# Patient Record
Sex: Male | Born: 1935 | Race: White | Hispanic: No | Smoking: Never smoker
Health system: Southern US, Community
[De-identification: ages and names within clinical notes are randomized; demographics above are authoritative.]

## PROBLEM LIST (undated history)

## (undated) DIAGNOSIS — R011 Cardiac murmur, unspecified: Secondary | ICD-10-CM

## (undated) DIAGNOSIS — C801 Malignant (primary) neoplasm, unspecified: Secondary | ICD-10-CM

## (undated) DIAGNOSIS — M1990 Unspecified osteoarthritis, unspecified site: Secondary | ICD-10-CM

## (undated) DIAGNOSIS — Z8709 Personal history of other diseases of the respiratory system: Secondary | ICD-10-CM

## (undated) DIAGNOSIS — R32 Unspecified urinary incontinence: Secondary | ICD-10-CM

## (undated) DIAGNOSIS — F32A Depression, unspecified: Secondary | ICD-10-CM

## (undated) DIAGNOSIS — J302 Other seasonal allergic rhinitis: Secondary | ICD-10-CM

## (undated) DIAGNOSIS — Z8619 Personal history of other infectious and parasitic diseases: Secondary | ICD-10-CM

## (undated) DIAGNOSIS — I6529 Occlusion and stenosis of unspecified carotid artery: Secondary | ICD-10-CM

## (undated) DIAGNOSIS — G709 Myoneural disorder, unspecified: Secondary | ICD-10-CM

## (undated) DIAGNOSIS — F419 Anxiety disorder, unspecified: Secondary | ICD-10-CM

## (undated) DIAGNOSIS — H269 Unspecified cataract: Secondary | ICD-10-CM

## (undated) DIAGNOSIS — N059 Unspecified nephritic syndrome with unspecified morphologic changes: Secondary | ICD-10-CM

## (undated) DIAGNOSIS — J45909 Unspecified asthma, uncomplicated: Secondary | ICD-10-CM

## (undated) HISTORY — DX: Occlusion and stenosis of unspecified carotid artery: I65.29

## (undated) HISTORY — DX: Personal history of other infectious and parasitic diseases: Z86.19

## (undated) HISTORY — DX: Unspecified osteoarthritis, unspecified site: M19.90

## (undated) HISTORY — DX: Unspecified urinary incontinence: R32

## (undated) HISTORY — DX: Other seasonal allergic rhinitis: J30.2

## (undated) HISTORY — DX: Cardiac murmur, unspecified: R01.1

## (undated) HISTORY — DX: Unspecified nephritic syndrome with unspecified morphologic changes: N05.9

## (undated) HISTORY — DX: Malignant (primary) neoplasm, unspecified: C80.1

## (undated) HISTORY — DX: Personal history of other diseases of the respiratory system: Z87.09

## (undated) HISTORY — DX: Unspecified cataract: H26.9

## (undated) HISTORY — DX: Myoneural disorder, unspecified: G70.9

## (undated) HISTORY — DX: Depression, unspecified: F32.A

## (undated) HISTORY — DX: Anxiety disorder, unspecified: F41.9

## (undated) HISTORY — PX: EYE SURGERY: SHX253

## (undated) HISTORY — DX: Unspecified asthma, uncomplicated: J45.909

## (undated) HISTORY — PX: SCROTAL SURGERY: SHX2387

---

## 1981-06-08 DIAGNOSIS — Z8709 Personal history of other diseases of the respiratory system: Secondary | ICD-10-CM

## 1981-06-08 HISTORY — DX: Personal history of other diseases of the respiratory system: Z87.09

## 2005-08-07 HISTORY — PX: COLONOSCOPY: SHX174

## 2014-06-12 DIAGNOSIS — Z7982 Long term (current) use of aspirin: Secondary | ICD-10-CM | POA: Diagnosis not present

## 2014-06-12 DIAGNOSIS — B349 Viral infection, unspecified: Secondary | ICD-10-CM | POA: Diagnosis not present

## 2014-08-06 DIAGNOSIS — Z125 Encounter for screening for malignant neoplasm of prostate: Secondary | ICD-10-CM | POA: Diagnosis not present

## 2014-08-06 DIAGNOSIS — E785 Hyperlipidemia, unspecified: Secondary | ICD-10-CM | POA: Diagnosis not present

## 2014-08-06 DIAGNOSIS — Z23 Encounter for immunization: Secondary | ICD-10-CM | POA: Diagnosis not present

## 2014-08-06 DIAGNOSIS — R590 Localized enlarged lymph nodes: Secondary | ICD-10-CM | POA: Diagnosis not present

## 2014-08-06 DIAGNOSIS — R0989 Other specified symptoms and signs involving the circulatory and respiratory systems: Secondary | ICD-10-CM | POA: Diagnosis not present

## 2014-08-06 DIAGNOSIS — Z0001 Encounter for general adult medical examination with abnormal findings: Secondary | ICD-10-CM | POA: Diagnosis not present

## 2014-08-06 DIAGNOSIS — R350 Frequency of micturition: Secondary | ICD-10-CM | POA: Diagnosis not present

## 2014-08-06 DIAGNOSIS — R011 Cardiac murmur, unspecified: Secondary | ICD-10-CM | POA: Diagnosis not present

## 2014-08-06 LAB — COMPREHENSIVE METABOLIC PANEL
ALT: 19
AST: 24 U/L
Alkaline Phosphatase: 74 U/L
BILIRUBIN, TOTAL: 0.5
BUN: 23 mg/dL — AB (ref 4–21)
CREATININE: 0.8
GLUCOSE: 100
POTASSIUM: 4.2 mmol/L
Sodium: 141

## 2014-08-06 LAB — LIPID PANEL
Cholesterol: 174 mg/dL (ref 0–200)
HDL: 47 mg/dL (ref 35–70)
LDL Cholesterol: 111 mg/dL
Triglycerides: 82 mg/dL (ref 40–160)

## 2014-08-06 LAB — PSA: PSA: 2.45

## 2014-08-10 DIAGNOSIS — R0989 Other specified symptoms and signs involving the circulatory and respiratory systems: Secondary | ICD-10-CM | POA: Diagnosis not present

## 2014-08-22 DIAGNOSIS — R011 Cardiac murmur, unspecified: Secondary | ICD-10-CM | POA: Diagnosis not present

## 2014-08-27 DIAGNOSIS — R011 Cardiac murmur, unspecified: Secondary | ICD-10-CM | POA: Diagnosis not present

## 2014-08-27 DIAGNOSIS — R590 Localized enlarged lymph nodes: Secondary | ICD-10-CM | POA: Diagnosis not present

## 2014-08-27 DIAGNOSIS — R0989 Other specified symptoms and signs involving the circulatory and respiratory systems: Secondary | ICD-10-CM | POA: Diagnosis not present

## 2014-09-12 DIAGNOSIS — R590 Localized enlarged lymph nodes: Secondary | ICD-10-CM | POA: Diagnosis not present

## 2014-11-03 DIAGNOSIS — Z7982 Long term (current) use of aspirin: Secondary | ICD-10-CM | POA: Diagnosis not present

## 2014-11-03 DIAGNOSIS — Z79899 Other long term (current) drug therapy: Secondary | ICD-10-CM | POA: Diagnosis not present

## 2014-11-03 DIAGNOSIS — L02212 Cutaneous abscess of back [any part, except buttock]: Secondary | ICD-10-CM | POA: Diagnosis not present

## 2014-11-07 DIAGNOSIS — L720 Epidermal cyst: Secondary | ICD-10-CM | POA: Diagnosis not present

## 2014-11-07 DIAGNOSIS — J45909 Unspecified asthma, uncomplicated: Secondary | ICD-10-CM | POA: Diagnosis not present

## 2014-11-07 DIAGNOSIS — N503 Cyst of epididymis: Secondary | ICD-10-CM | POA: Diagnosis not present

## 2014-11-14 DIAGNOSIS — L720 Epidermal cyst: Secondary | ICD-10-CM | POA: Diagnosis not present

## 2015-03-12 DIAGNOSIS — Z85828 Personal history of other malignant neoplasm of skin: Secondary | ICD-10-CM | POA: Diagnosis not present

## 2015-03-12 DIAGNOSIS — L570 Actinic keratosis: Secondary | ICD-10-CM | POA: Diagnosis not present

## 2015-03-12 DIAGNOSIS — D2262 Melanocytic nevi of left upper limb, including shoulder: Secondary | ICD-10-CM | POA: Diagnosis not present

## 2015-03-12 DIAGNOSIS — L821 Other seborrheic keratosis: Secondary | ICD-10-CM | POA: Diagnosis not present

## 2015-03-12 DIAGNOSIS — Z08 Encounter for follow-up examination after completed treatment for malignant neoplasm: Secondary | ICD-10-CM | POA: Diagnosis not present

## 2015-03-12 DIAGNOSIS — D2261 Melanocytic nevi of right upper limb, including shoulder: Secondary | ICD-10-CM | POA: Diagnosis not present

## 2015-03-12 DIAGNOSIS — L814 Other melanin hyperpigmentation: Secondary | ICD-10-CM | POA: Diagnosis not present

## 2015-03-12 DIAGNOSIS — L820 Inflamed seborrheic keratosis: Secondary | ICD-10-CM | POA: Diagnosis not present

## 2015-04-29 DIAGNOSIS — Z23 Encounter for immunization: Secondary | ICD-10-CM | POA: Diagnosis not present

## 2015-06-17 DIAGNOSIS — L570 Actinic keratosis: Secondary | ICD-10-CM | POA: Diagnosis not present

## 2015-07-01 DIAGNOSIS — R350 Frequency of micturition: Secondary | ICD-10-CM | POA: Diagnosis not present

## 2015-08-22 DIAGNOSIS — J019 Acute sinusitis, unspecified: Secondary | ICD-10-CM | POA: Diagnosis not present

## 2015-11-11 ENCOUNTER — Encounter: Payer: Self-pay | Admitting: Primary Care

## 2015-11-11 ENCOUNTER — Ambulatory Visit (INDEPENDENT_AMBULATORY_CARE_PROVIDER_SITE_OTHER): Payer: Medicare Other | Admitting: Primary Care

## 2015-11-11 VITALS — BP 124/64 | HR 96 | Temp 98.3°F | Ht 70.0 in | Wt 171.1 lb

## 2015-11-11 DIAGNOSIS — J209 Acute bronchitis, unspecified: Secondary | ICD-10-CM

## 2015-11-11 MED ORDER — AMOXICILLIN-POT CLAVULANATE 875-125 MG PO TABS: 1.0000 | ORAL_TABLET | Freq: Two times a day (BID) | ORAL | Status: DC

## 2015-11-11 NOTE — Progress Notes (Signed)
Pre visit review using our clinic review tool, if applicable. No additional management support is needed unless otherwise documented below in the visit note. 

## 2015-11-11 NOTE — Patient Instructions (Signed)
Start Augmentin antibiotics. Take 1 tablet by mouth twice daily for 10 days.  Continue Robitussin as needed for cough.   You may also try taking Mucinex DM for chest congestion. Ensure you take this with a full glass of water.  Ensure you are staying hydrated with water.  Please call me if no improvement in 3-4 days.  It was a pleasure meeting you!  Acute Bronchitis Bronchitis is inflammation of the airways that extend from the windpipe into the lungs (bronchi). The inflammation often causes mucus to develop. This leads to a cough, which is the most common symptom of bronchitis.  In acute bronchitis, the condition usually develops suddenly and goes away over time, usually in a couple weeks. Smoking, allergies, and asthma can make bronchitis worse. Repeated episodes of bronchitis may cause further lung problems.  CAUSES Acute bronchitis is most often caused by the same virus that causes a cold. The virus can spread from person to person (contagious) through coughing, sneezing, and touching contaminated objects. SIGNS AND SYMPTOMS   Cough.   Fever.   Coughing up mucus.   Body aches.   Chest congestion.   Chills.   Shortness of breath.   Sore throat.  DIAGNOSIS  Acute bronchitis is usually diagnosed through a physical exam. Your health care provider will also ask you questions about your medical history. Tests, such as chest X-rays, are sometimes done to rule out other conditions.  TREATMENT  Acute bronchitis usually goes away in a couple weeks. Oftentimes, no medical treatment is necessary. Medicines are sometimes given for relief of fever or cough. Antibiotic medicines are usually not needed but may be prescribed in certain situations. In some cases, an inhaler may be recommended to help reduce shortness of breath and control the cough. A cool mist vaporizer may also be used to help thin bronchial secretions and make it easier to clear the chest.  HOME CARE  INSTRUCTIONS  Get plenty of rest.   Drink enough fluids to keep your urine clear or pale yellow (unless you have a medical condition that requires fluid restriction). Increasing fluids may help thin your respiratory secretions (sputum) and reduce chest congestion, and it will prevent dehydration.   Take medicines only as directed by your health care provider.  If you were prescribed an antibiotic medicine, finish it all even if you start to feel better.  Avoid smoking and secondhand smoke. Exposure to cigarette smoke or irritating chemicals will make bronchitis worse. If you are a smoker, consider using nicotine gum or skin patches to help control withdrawal symptoms. Quitting smoking will help your lungs heal faster.   Reduce the chances of another bout of acute bronchitis by washing your hands frequently, avoiding people with cold symptoms, and trying not to touch your hands to your mouth, nose, or eyes.   Keep all follow-up visits as directed by your health care provider.  SEEK MEDICAL CARE IF: Your symptoms do not improve after 1 week of treatment.  SEEK IMMEDIATE MEDICAL CARE IF:  You develop an increased fever or chills.   You have chest pain.   You have severe shortness of breath.  You have bloody sputum.   You develop dehydration.  You faint or repeatedly feel like you are going to pass out.  You develop repeated vomiting.  You develop a severe headache. MAKE SURE YOU:   Understand these instructions.  Will watch your condition.  Will get help right away if you are not doing well or get worse.  This information is not intended to replace advice given to you by your health care provider. Make sure you discuss any questions you have with your health care provider.   Document Released: 07/02/2004 Document Revised: 06/15/2014 Document Reviewed: 11/15/2012 Elsevier Interactive Patient Education [employer].

## 2015-11-11 NOTE — Progress Notes (Signed)
   Subjective:    Patient ID: Harry Ray, male    DOB: 19-Jan-1932, 82 y.o.   MRN: [id number]  HPI  Harry Ray is an 82 year old male, new to our practice to establish with Dr. Danise Mina in September 2017, who presents today with a chief complaint of nasal congestion. He also reports cough, chest congestion, fatigue. His symptoms have been present for the past 2 weeks. Denies fevers, shortness of breath, sore throat, ear pain.   His cough is productive with yellow sputum. He's also blowing yellow mucous from his nasal cavity. He's been taking Robitussin and aspirin without much improvement. Overall his symptoms are about the same. Denies sick contacts.   Review of Systems  Constitutional: Positive for fatigue. Negative for fever.  HENT: Positive for sinus pressure. Negative for congestion, ear pain and sore throat.   Respiratory: Positive for cough. Negative for shortness of breath.   Cardiovascular: Negative for chest pain.       No past medical history on file.   Social History   Social History  . Marital Status: Married    Spouse Name: N/A  . Number of Children: N/A  . Years of Education: N/A   Occupational History  . Not on file.   Social History Main Topics  . Smoking status: Never Smoker   . Smokeless tobacco: Not on file  . Alcohol Use: No  . Drug Use: No  . Sexual Activity: Not on file   Other Topics Concern  . Not on file   Social History Narrative  . No narrative on file    No past surgical history on file.  No family history on file.  No Known Allergies  No current outpatient prescriptions on file prior to visit.   No current facility-administered medications on file prior to visit.    BP 124/64 mmHg  Pulse 96  Temp(Src) 98.3 F (36.8 C) (Oral)  Ht 5\' 10"  (1.778 m)  Wt 171 lb 1.9 oz (77.62 kg)  BMI 24.55 kg/m2  SpO2 96%    Objective:   Physical Exam  Constitutional: He appears well-nourished. He appears ill.  HENT:  Right Ear:  Tympanic membrane and ear canal normal.  Left Ear: Tympanic membrane and ear canal normal.  Nose: No mucosal edema. Right sinus exhibits no maxillary sinus tenderness and no frontal sinus tenderness. Left sinus exhibits no maxillary sinus tenderness and no frontal sinus tenderness.  Mouth/Throat: Oropharynx is clear and moist.  Eyes: Conjunctivae are normal.  Neck: Neck supple.  Cardiovascular: Normal rate and regular rhythm.   Pulmonary/Chest: Effort normal. He has no wheezes. He has rhonchi in the right upper field, the right lower field, the left upper field and the left lower field.  Skin: Skin is warm and dry.          Assessment & Plan:  Acute Bronchitis:  Cough, chest congestion, fatigue x 2 weeks. Temporary improvement with Robitussin. Exam today with moderate rhonchi throughout all lung fields. No diminished sounds, vitals stable. Does appear ill, but not toxic. Given duration of symptoms, presentation, examination, will treat for likely bacterial involvement. Rx for Augmentin course sent to pharmacy. Continue Robitussin PRN. Start Mucinex DM for chest congestion. Fluids, rest, return precautions provided.

## 2016-02-14 ENCOUNTER — Telehealth: Payer: Self-pay | Admitting: Family Medicine

## 2016-02-14 NOTE — Telephone Encounter (Signed)
Rec'd from [place] forward 40 pages to Dr.Gutlerrez

## 2016-02-18 ENCOUNTER — Ambulatory Visit (INDEPENDENT_AMBULATORY_CARE_PROVIDER_SITE_OTHER): Payer: Medicare Other | Admitting: Family Medicine

## 2016-02-18 ENCOUNTER — Encounter: Payer: Self-pay | Admitting: Family Medicine

## 2016-02-18 VITALS — BP 136/82 | HR 80 | Temp 98.1°F | Ht 69.5 in | Wt 174.8 lb

## 2016-02-18 DIAGNOSIS — R399 Unspecified symptoms and signs involving the genitourinary system: Secondary | ICD-10-CM | POA: Diagnosis not present

## 2016-02-18 DIAGNOSIS — R011 Cardiac murmur, unspecified: Secondary | ICD-10-CM | POA: Insufficient documentation

## 2016-02-18 DIAGNOSIS — Z23 Encounter for immunization: Secondary | ICD-10-CM | POA: Diagnosis not present

## 2016-02-18 DIAGNOSIS — N400 Enlarged prostate without lower urinary tract symptoms: Secondary | ICD-10-CM | POA: Insufficient documentation

## 2016-02-18 LAB — POC URINALSYSI DIPSTICK (AUTOMATED)
Bilirubin, UA: NEGATIVE
Glucose, UA: NEGATIVE
KETONES UA: NEGATIVE
Leukocytes, UA: NEGATIVE
Nitrite, UA: NEGATIVE
PH UA: 6
PROTEIN UA: NEGATIVE
RBC UA: NEGATIVE
SPEC GRAV UA: 1.01
Urobilinogen, UA: 0.2

## 2016-02-18 MED ORDER — TAMSULOSIN HCL 0.4 MG PO CAPS: 0.4000 mg | ORAL_CAPSULE | Freq: Every day | ORAL | 3 refills | Status: DC

## 2016-02-18 NOTE — Assessment & Plan Note (Signed)
Heard today. Discussed red flags to monitor for.

## 2016-02-18 NOTE — Assessment & Plan Note (Addendum)
Ongoing for 2 yrs. Check UA today.  Describes urge incontinence.  ?BPH related - will trial flomax 0.4mg  nightly.  Will review prior PCP records when they arrive. Pt will update me with effect in 2-4 wks.  Pt agrees with plan.

## 2016-02-18 NOTE — Progress Notes (Signed)
Pre visit review using our clinic review tool, if applicable. No additional management support is needed unless otherwise documented below in the visit note. 

## 2016-02-18 NOTE — Patient Instructions (Addendum)
Flu shot today Urinalysis today - normal.  Urine symptoms likely from prostate. Trial flomax for flow.  We will await records from prior PCP and review when they arrive. Return as needed or in Spring 2018 for your next medicare wellness visit Good to meet you today, call us with questions.

## 2016-02-18 NOTE — Progress Notes (Signed)
BP 136/82   Pulse 80   Temp 98.1 F (36.7 C) (Oral)   Ht 5' 9.5" (1.765 m)   Wt 174 lb 12 oz (79.3 kg)   BMI 25.44 kg/m    CC: new pt to establish care Subjective:    Patient ID: Harry Ray, male    DOB: 05-18-1932, 82 y.o.   MRN: [id number]  HPI: Harry Ray is a 82 y.o. male presenting on 02/18/2016 for Establish Care   Prior saw Dr Katheran Awe, records have been requested. Recently moved to [facility] in May 2017 from [place]. Saw Anda Kraft 3 mo ago for acute bronchitis.   Ongoing urinary symptoms over last 2-3 yrs - nocturia 1-3x, daytime urgency, slight leakage. Incomplete emptying. Has never been told abnormal prostate enlargement. Denies fevers/chills, nausea/vomiting, dysuria, hematuria. No flank pain, abd pain, back pain. No foot on floor or key in door phenomenon. + urinary urge when turning on water. No stress incontinence symptoms. No hesitancy of stream or dribbling, some weakening of stream.   Preventative: Last CPE spring 2017  Lives with wife at [facility], no pets Son and family live in [place]: retired, was [employer] Activity: starting [employer] exercise program 2x/wk, enjoys walking  Relevant past medical, surgical, family and social history reviewed and updated as indicated. Interim medical history since our last visit reviewed. Allergies and medications reviewed and updated. Current Outpatient Prescriptions on File Prior to Visit  Medication Sig  . aspirin 81 MG tablet Take 81 mg by mouth daily.   No current facility-administered medications on file prior to visit.    Past Medical History:  Diagnosis Date  . Arthritis    pinkys bilaterally and left thumb  . Heart murmur    minor  . History of asthma 1983   asthma attack as reaction to allergy medication (2d ICU stay)  . History of chicken pox   . Nephritis childhood  . Seasonal allergies    spring  . Urine incontinence     Past Surgical History:    Procedure Laterality Date  . SCROTAL SURGERY     ? benign cyst removal    Family History  Problem Relation Age of Onset  . Dementia Mother   . Cancer Maternal Grandfather     stomach  . Cancer Paternal Uncle     throat - smoker  . Diabetes Neg Hx   . CAD Neg Hx     Social History  Substance Use Topics  . Smoking status: Never Smoker  . Smokeless tobacco: Never Used  . Alcohol use No    Review of Systems Per HPI unless specifically indicated in ROS section     Objective:    BP 136/82   Pulse 80   Temp 98.1 F (36.7 C) (Oral)   Ht 5' 9.5" (1.765 m)   Wt 174 lb 12 oz (79.3 kg)   BMI 25.44 kg/m   Wt Readings from Last 3 Encounters:  02/18/16 174 lb 12 oz (79.3 kg)  11/11/15 171 lb 1.9 oz (77.6 kg)    Physical Exam  Constitutional: He appears well-developed and well-nourished. No distress.  HENT:  Head: Normocephalic and atraumatic.  Mouth/Throat: Oropharynx is clear and moist. No oropharyngeal exudate.  Eyes: Conjunctivae are normal. Pupils are equal, round, and reactive to light. No scleral icterus.  Neck: Normal range of motion. Neck supple. No thyromegaly present.  Cardiovascular: Normal rate, regular rhythm and intact distal pulses.   Murmur (  2/6 SEM) heard. Pulmonary/Chest: Effort normal and breath sounds normal. No respiratory distress. He has no wheezes. He has no rales.  Abdominal: Soft. Normal appearance and bowel sounds are normal. He exhibits no distension and no mass. There is no hepatosplenomegaly. There is no tenderness. There is no rebound, no guarding and no CVA tenderness.  Musculoskeletal: He exhibits no edema.  Lymphadenopathy:    He has no cervical adenopathy.  Skin: Skin is warm and dry. No rash noted.  Psychiatric: He has a normal mood and affect.  Nursing note and vitals reviewed.  Results for orders placed or performed in visit on 02/18/16  POCT Urinalysis Dipstick (Automated)  Result Value Ref Range   Color, UA Straw    Clarity, UA  Clear    Glucose, UA Negative    Bilirubin, UA Negative    Ketones, UA Negative    Spec Grav, UA 1.010    Blood, UA Negative    pH, UA 6.0    Protein, UA Negative    Urobilinogen, UA 0.2    Nitrite, UA Negative    Leukocytes, UA Negative Negative      Assessment & Plan:  Over 25 minutes were spent face-to-face with the patient during this encounter and >50% of that time was spent on counseling and coordination of care  Problem List Items Addressed This Visit    Heart murmur    Heard today. Discussed red flags to monitor for.      Lower urinary tract symptoms (LUTS) - Primary    Ongoing for 2 yrs. Check UA today.  Describes urge incontinence.  ?BPH related - will trial flomax 0.4mg  nightly.  Will review prior PCP records when they arrive. Pt will update me with effect in 2-4 wks.  Pt agrees with plan.      Relevant Orders   POCT Urinalysis Dipstick (Automated) (Completed)    Other Visit Diagnoses    Need for influenza vaccination       Relevant Orders   Flu Vaccine QUAD 36+ mos PF IM (Fluarix & Fluzone Quad PF) (Completed)       Follow up plan: Return for medicare wellness visit.  Ria Bush, MD

## 2016-03-07 ENCOUNTER — Encounter: Payer: Self-pay | Admitting: Family Medicine

## 2016-03-11 ENCOUNTER — Encounter: Payer: Self-pay | Admitting: *Deleted

## 2016-03-17 ENCOUNTER — Encounter: Payer: Self-pay | Admitting: Family Medicine

## 2016-03-18 ENCOUNTER — Encounter: Payer: Self-pay | Admitting: Primary Care

## 2016-04-01 ENCOUNTER — Encounter: Payer: Self-pay | Admitting: Family Medicine

## 2016-04-02 MED ORDER — TAMSULOSIN HCL 0.4 MG PO CAPS: 0.8000 mg | ORAL_CAPSULE | Freq: Every day | ORAL | 6 refills | Status: DC

## 2016-04-27 DIAGNOSIS — L821 Other seborrheic keratosis: Secondary | ICD-10-CM | POA: Diagnosis not present

## 2016-04-27 DIAGNOSIS — X32XXXA Exposure to sunlight, initial encounter: Secondary | ICD-10-CM | POA: Diagnosis not present

## 2016-04-27 DIAGNOSIS — L570 Actinic keratosis: Secondary | ICD-10-CM | POA: Diagnosis not present

## 2016-04-27 DIAGNOSIS — D225 Melanocytic nevi of trunk: Secondary | ICD-10-CM | POA: Diagnosis not present

## 2016-06-11 DIAGNOSIS — H353131 Nonexudative age-related macular degeneration, bilateral, early dry stage: Secondary | ICD-10-CM | POA: Diagnosis not present

## 2016-08-16 ENCOUNTER — Other Ambulatory Visit: Payer: Self-pay | Admitting: Family Medicine

## 2016-08-16 DIAGNOSIS — R399 Unspecified symptoms and signs involving the genitourinary system: Secondary | ICD-10-CM

## 2016-08-17 ENCOUNTER — Other Ambulatory Visit: Payer: Medicare Other

## 2016-08-17 ENCOUNTER — Ambulatory Visit: Payer: Medicare Other

## 2016-08-18 ENCOUNTER — Encounter: Payer: Self-pay | Admitting: Primary Care

## 2016-08-18 NOTE — Telephone Encounter (Signed)
Hi [name],  See the My Chart message below regarding rescheduling the [place]. Looks like he was scheduled to see you yesterday which was cancelled due to the weather. Please respond to him, thanks! Allie Bossier, NP-C

## 2016-08-19 ENCOUNTER — Other Ambulatory Visit (INDEPENDENT_AMBULATORY_CARE_PROVIDER_SITE_OTHER): Payer: Medicare Other

## 2016-08-19 ENCOUNTER — Other Ambulatory Visit: Payer: Self-pay | Admitting: Family Medicine

## 2016-08-19 DIAGNOSIS — R399 Unspecified symptoms and signs involving the genitourinary system: Secondary | ICD-10-CM | POA: Diagnosis not present

## 2016-08-19 DIAGNOSIS — R3914 Feeling of incomplete bladder emptying: Secondary | ICD-10-CM

## 2016-08-19 DIAGNOSIS — N401 Enlarged prostate with lower urinary tract symptoms: Secondary | ICD-10-CM

## 2016-08-19 LAB — CBC WITH DIFFERENTIAL/PLATELET
BASOS PCT: 0.3 % (ref 0.0–3.0)
Basophils Absolute: 0 10*3/uL (ref 0.0–0.1)
Eosinophils Absolute: 0.4 10*3/uL (ref 0.0–0.7)
Eosinophils Relative: 5.3 % — ABNORMAL HIGH (ref 0.0–5.0)
HEMATOCRIT: 41.1 % (ref 39.0–52.0)
HEMOGLOBIN: 13.9 g/dL (ref 13.0–17.0)
Lymphocytes Relative: 21.1 % (ref 12.0–46.0)
Lymphs Abs: 1.5 10*3/uL (ref 0.7–4.0)
MCHC: 33.8 g/dL (ref 30.0–36.0)
MCV: 93.2 fl (ref 78.0–100.0)
MONOS PCT: 9.3 % (ref 3.0–12.0)
Monocytes Absolute: 0.6 10*3/uL (ref 0.1–1.0)
NEUTROS ABS: 4.4 10*3/uL (ref 1.4–7.7)
Neutrophils Relative %: 64 % (ref 43.0–77.0)
PLATELETS: 256 10*3/uL (ref 150.0–400.0)
RBC: 4.41 Mil/uL (ref 4.22–5.81)
RDW: 13.2 % (ref 11.5–15.5)
WBC: 6.9 10*3/uL (ref 4.0–10.5)

## 2016-08-19 LAB — COMPREHENSIVE METABOLIC PANEL
ALBUMIN: 4.2 g/dL (ref 3.5–5.2)
ALT: 13 U/L (ref 0–53)
AST: 17 U/L (ref 0–37)
Alkaline Phosphatase: 53 U/L (ref 39–117)
BUN: 20 mg/dL (ref 6–23)
CALCIUM: 9.4 mg/dL (ref 8.4–10.5)
CHLORIDE: 105 meq/L (ref 96–112)
CO2: 28 meq/L (ref 19–32)
Creatinine, Ser: 0.87 mg/dL (ref 0.40–1.50)
GFR: 89.08 mL/min (ref >60.00)
Glucose, Bld: 92 mg/dL (ref 70–99)
POTASSIUM: 4.1 meq/L (ref 3.5–5.1)
Sodium: 140 mEq/L (ref 135–145)
Total Bilirubin: 0.6 mg/dL (ref 0.2–1.2)
Total Protein: 7.1 g/dL (ref 6.0–8.3)

## 2016-08-19 NOTE — Addendum Note (Signed)
Addended by: Royann Shivers A on: 08/19/2016 01:48 PM   Modules accepted: Orders

## 2016-08-19 NOTE — Addendum Note (Signed)
Addended by: Ellamae Sia on: 08/19/2016 03:50 PM   Modules accepted: Orders

## 2016-08-21 ENCOUNTER — Ambulatory Visit (INDEPENDENT_AMBULATORY_CARE_PROVIDER_SITE_OTHER): Payer: Medicare Other | Admitting: Primary Care

## 2016-08-21 ENCOUNTER — Encounter: Payer: Self-pay | Admitting: Primary Care

## 2016-08-21 VITALS — BP 124/80 | HR 52 | Temp 97.9°F | Ht 69.5 in | Wt 176.1 lb

## 2016-08-21 DIAGNOSIS — L03312 Cellulitis of back [any part except buttock]: Secondary | ICD-10-CM | POA: Diagnosis not present

## 2016-08-21 DIAGNOSIS — L0292 Furuncle, unspecified: Secondary | ICD-10-CM

## 2016-08-21 MED ORDER — SULFAMETHOXAZOLE-TRIMETHOPRIM 800-160 MG PO TABS: 1.0000 | ORAL_TABLET | Freq: Two times a day (BID) | ORAL | 0 refills | Status: DC

## 2016-08-21 NOTE — Progress Notes (Signed)
Pre visit review using our clinic review tool, if applicable. No additional management support is needed unless otherwise documented below in the visit note. 

## 2016-08-21 NOTE — Patient Instructions (Signed)
Start Bactrim DS (sulfamethoxazole/trimethoprim) tablets for skin infection Take 1 tablet by mouth twice daily for 7 days.  Keep the area covered and dry. Allow the wound to drain as discussed.  Follow up with Dr. Danise Mina as scheduled.  It was a pleasure to see you today!

## 2016-08-21 NOTE — Progress Notes (Signed)
Subjective:    Patient ID: Harry Ray, male    DOB: Jul 22, 1931, 83 y.o.   MRN: [id number]  HPI  Mr. Hoecker is a 83 year old male who presents today with a chief complaint of skin wound. His wound is located to the mid lower thoracic back that he first noticed 3-4 days ago. He thinks he has a boil as it busted open. The drainage was mostly blood, but did see a white/yellow puss. His wife was able to expel some puss last night. He does have a history of boils in the past. He denies fevers, weakness, fatigue.   Review of Systems  Constitutional: Negative for fatigue and fever.  Skin: Positive for color change and wound.       Past Medical History:  Diagnosis Date  . Arthritis    pinkys bilaterally and left thumb  . Heart murmur    minor  . History of asthma 1983   asthma attack as reaction to allergy medication (2d ICU stay)  . History of chicken pox   . Nephritis childhood  . Seasonal allergies    spring  . Urine incontinence      Social History   Social History  . Marital status: Married    Spouse name: N/A  . Number of children: N/A  . Years of education: N/A   Occupational History  . Not on file.   Social History Main Topics  . Smoking status: Never Smoker  . Smokeless tobacco: Never Used  . Alcohol use No  . Drug use: No  . Sexual activity: Not on file   Other Topics Concern  . Not on file   Social History Narrative   Lives with wife at [facility], no pets   Son and family live in [place]: retired, was [employer]   Activity: starting [employer] exercise program 2x/wk, enjoys walking    Past Surgical History:  Procedure Laterality Date  . COLONOSCOPY  08/07/2005   diverticulosis  . SCROTAL SURGERY     ? benign cyst removal    Family History  Problem Relation Age of Onset  . Dementia Mother     Pick's disease  . Cancer Maternal Grandfather     stomach  . Cancer Paternal Uncle     throat - smoker  . Diabetes Neg Hx    . CAD Neg Hx     No Known Allergies  Current Outpatient Prescriptions on File Prior to Visit  Medication Sig Dispense Refill  . aspirin 81 MG tablet Take 81 mg by mouth daily.    . tamsulosin (FLOMAX) 0.4 MG CAPS capsule Take 2 capsules (0.8 mg total) by mouth daily. 60 capsule 6   No current facility-administered medications on file prior to visit.     BP 124/80   Pulse (!) 52   Temp 97.9 F (36.6 C) (Oral)   Ht 5' 9.5" (1.765 m)   Wt 176 lb 1.9 oz (79.9 kg)   SpO2 99%   BMI 25.64 kg/m    Objective:   Physical Exam  Constitutional: He appears well-nourished.  Neck: Neck supple.  Cardiovascular: Normal rate.   Pulmonary/Chest: Effort normal and breath sounds normal.  Skin: Skin is warm.  1 cm circular open wound with presence of light green puss. Surround erythema suggestive of cellulitis. Tender.          Assessment & Plan:  Cellulitis secondary to Abscess:  Skin wound x 3-4 days.  Opened with drainage last night. Exam today with opened abscess with mild surrounding cellulitis. Presence of light green puss today.  Rx for Bactrim DS tablets sent to pharmacy. Dressing applied. Discussed home care instructions.  Sheral Flow, NP

## 2016-08-25 ENCOUNTER — Encounter: Payer: Self-pay | Admitting: Family Medicine

## 2016-08-25 ENCOUNTER — Ambulatory Visit (INDEPENDENT_AMBULATORY_CARE_PROVIDER_SITE_OTHER): Payer: Medicare Other | Admitting: Family Medicine

## 2016-08-25 VITALS — BP 114/70 | HR 84 | Temp 97.6°F | Ht 70.5 in | Wt 172.0 lb

## 2016-08-25 DIAGNOSIS — R011 Cardiac murmur, unspecified: Secondary | ICD-10-CM | POA: Diagnosis not present

## 2016-08-25 DIAGNOSIS — R350 Frequency of micturition: Secondary | ICD-10-CM | POA: Diagnosis not present

## 2016-08-25 DIAGNOSIS — Z7189 Other specified counseling: Secondary | ICD-10-CM | POA: Insufficient documentation

## 2016-08-25 DIAGNOSIS — N401 Enlarged prostate with lower urinary tract symptoms: Secondary | ICD-10-CM | POA: Diagnosis not present

## 2016-08-25 DIAGNOSIS — Z0000 Encounter for general adult medical examination without abnormal findings: Secondary | ICD-10-CM | POA: Diagnosis not present

## 2016-08-25 DIAGNOSIS — R0989 Other specified symptoms and signs involving the circulatory and respiratory systems: Secondary | ICD-10-CM | POA: Diagnosis not present

## 2016-08-25 MED ORDER — FINASTERIDE 5 MG PO TABS: 5.0000 mg | ORAL_TABLET | Freq: Every day | ORAL | 11 refills | Status: DC

## 2016-08-25 NOTE — Patient Instructions (Addendum)
You are doing well today.  Bring me copy of your living will to update your chart.  Prostate blood test today. Let's start finasteride once daily.  We will check carotid ultrasound.  Return in 6 months for follow up prostate.   Health Maintenance, Male A healthy lifestyle and preventive care is important for your health and wellness. Ask your health care provider about what schedule of regular examinations is right for you. What should I know about weight and diet?  Eat a Healthy Diet  Eat plenty of vegetables, fruits, whole grains, low-fat dairy products, and lean protein.  Do not eat a lot of foods high in solid fats, added sugars, or salt. Maintain a Healthy Weight  Regular exercise can help you achieve or maintain a healthy weight. You should:  Do at least 150 minutes of exercise each week. The exercise should increase your heart rate and make you sweat (moderate-intensity exercise).  Do strength-training exercises at least twice a week. Watch Your Levels of Cholesterol and Blood Lipids  Have your blood tested for lipids and cholesterol every 5 years starting at 81 years of age. If you are at high risk for heart disease, you should start having your blood tested when you are 81 years old. You may need to have your cholesterol levels checked more often if:  Your lipid or cholesterol levels are high.  You are older than 81 years of age.  You are at high risk for heart disease. What should I know about cancer screening? Many types of cancers can be detected early and may often be prevented. Lung Cancer  You should be screened every year for lung cancer if:  You are a current smoker who has smoked for at least 30 years.  You are a former smoker who has quit within the past 15 years.  Talk to your health care provider about your screening options, when you should start screening, and how often you should be screened. Colorectal Cancer  Routine colorectal cancer screening  usually begins at 81 years of age and should be repeated every 5-10 years until you are 81 years old. You may need to be screened more often if early forms of precancerous polyps or small growths are found. Your health care provider may recommend screening at an earlier age if you have risk factors for colon cancer.  Your health care provider may recommend using home test kits to check for hidden blood in the stool.  A small camera at the end of a tube can be used to examine your colon (sigmoidoscopy or colonoscopy). This checks for the earliest forms of colorectal cancer. Prostate and Testicular Cancer  Depending on your age and overall health, your health care provider may do certain tests to screen for prostate and testicular cancer.  Talk to your health care provider about any symptoms or concerns you have about testicular or prostate cancer. Skin Cancer  Check your skin from head to toe regularly.  Tell your health care provider about any new moles or changes in moles, especially if:  There is a change in a mole's size, shape, or color.  You have a mole that is larger than a pencil eraser.  Always use sunscreen. Apply sunscreen liberally and repeat throughout the day.  Protect yourself by wearing long sleeves, pants, a wide-brimmed hat, and sunglasses when outside. What should I know about heart disease, diabetes, and high blood pressure?  If you are 22-59 years of age, have your blood pressure  checked every 3-5 years. If you are 8 years of age or older, have your blood pressure checked every year. You should have your blood pressure measured twice-once when you are at a hospital or clinic, and once when you are not at a hospital or clinic. Record the average of the two measurements. To check your blood pressure when you are not at a hospital or clinic, you can use:  An automated blood pressure machine at a pharmacy.  A home blood pressure monitor.  Talk to your health care  provider about your target blood pressure.  If you are between 9-40 years old, ask your health care provider if you should take aspirin to prevent heart disease.  Have regular diabetes screenings by checking your fasting blood sugar level.  If you are at a normal weight and have a low risk for diabetes, have this test once every three years after the age of 74.  If you are overweight and have a high risk for diabetes, consider being tested at a younger age or more often.  A one-time screening for abdominal aortic aneurysm (AAA) by ultrasound is recommended for men aged 42-75 years who are current or former smokers. What should I know about preventing infection? Hepatitis B  If you have a higher risk for hepatitis B, you should be screened for this virus. Talk with your health care provider to find out if you are at risk for hepatitis B infection. Hepatitis C  Blood testing is recommended for:  Everyone born from [birth year] through 1963.  Anyone with known risk factors for hepatitis C. Sexually Transmitted Diseases (STDs)  You should be screened each year for STDs including gonorrhea and chlamydia if:  You are sexually active and are younger than 81 years of age.  You are older than 81 years of age and your health care provider tells you that you are at risk for this type of infection.  Your sexual activity has changed since you were last screened and you are at an increased risk for chlamydia or gonorrhea. Ask your health care provider if you are at risk.  Talk with your health care provider about whether you are at high risk of being infected with HIV. Your health care provider may recommend a prescription medicine to help prevent HIV infection. What else can I do?  Schedule regular health, dental, and eye exams.  Stay current with your vaccines (immunizations).  Do not use any tobacco products, such as cigarettes, chewing tobacco, and e-cigarettes. If you need help quitting, ask  your health care provider.  Limit alcohol intake to no more than 2 drinks per day. One drink equals 12 ounces of beer, 5 ounces of wine, or 1 ounces of hard liquor.  Do not use street drugs.  Do not share needles.  Ask your health care provider for help if you need support or information about quitting drugs.  Tell your health care provider if you often feel depressed.  Tell your health care provider if you have ever been abused or do not feel safe at home. This information is not intended to replace advice given to you by your health care provider. Make sure you discuss any questions you have with your health care provider. Document Released: 11/21/2007 Document Revised: 01/22/2016 Document Reviewed: 02/26/2015 Elsevier Interactive Patient Education  2017 [employer].

## 2016-08-25 NOTE — Assessment & Plan Note (Signed)
Preventative protocols reviewed and updated unless pt declined. Discussed healthy diet and lifestyle.  

## 2016-08-25 NOTE — Assessment & Plan Note (Signed)
Advanced directive discussion - has at home. Will bring me copy. Wife is HCPOA.

## 2016-08-25 NOTE — Assessment & Plan Note (Addendum)
Chronic, persistent LUTS mildly improved on high dose flomax.  DRE with evidence of BPH. Will start finasteride 5mg  daily.  RTC 6 mo f/u visit.

## 2016-08-25 NOTE — Progress Notes (Signed)
BP 114/70 (BP Location: Left Arm, Patient Position: Sitting, Cuff Size: Large)   Pulse 84   Temp 97.6 F (36.4 C) (Oral)   Ht 5' 10.5" (1.791 m)   Wt 172 lb (78 kg)   SpO2 99%   BMI 24.33 kg/m    CC: medicare wellness visit Subjective:    Patient ID: Harry Ray, male    DOB: 12/13/1931, 83 y.o.   MRN: [id number]  HPI: Harry Ray is a 83 y.o. male presenting on 08/25/2016 for Medicare Wellness (Saw his labs on MyChart); Annual Exam; Abscess (Saw Anda Kraft last week for spot on his back); and Epistaxis (Some dry nasal passages. Uses gas heat with no humidifier)   Recent eval for cellulitis - seen Friday, by Allie Bossier treated with bactrim DS.  Epistaxis - presumed due to dry nasal passages - uses gas heat without humidifier.   BPH with LUTS - on flomax 0.8mg  daily. Nocturia   Hearing screen - passed Vision screen - with eye doctor Fall risk screen - see screening sections Depression screen - see screening sections of chart  Preventative: COLONOSCOPY 08/07/2005 Diverticulosis (Colon) Prostate cancer screening - see above Lung cancer screening - not eligible Flu shot - yearly Tdap 2016 Pneumovax 2011, prevnar 2016 Shingles shot - 2011 Advanced directive discussion - has at home. Will bring me copy. Wife is HCPOA.  Seat belt use discussed Sunscreen use discussed, no changing moles on skin. Sees derm yearly Non smoker Alcohol - none  Lives with wife at [facility], no pets Son and family live in [place]: retired, was [employer] Activity: starting [employer] exercise program 2x/wk, enjoys walking  Relevant past medical, surgical, family and social history reviewed and updated as indicated. Interim medical history since our last visit reviewed. Allergies and medications reviewed and updated. Outpatient Medications Prior to Visit  Medication Sig Dispense Refill  . aspirin 81 MG tablet Take 81 mg by mouth daily.    Marland Kitchen sulfamethoxazole-trimethoprim  (BACTRIM DS,SEPTRA DS) 800-160 MG tablet Take 1 tablet by mouth 2 (two) times daily. 14 tablet 0  . tamsulosin (FLOMAX) 0.4 MG CAPS capsule Take 2 capsules (0.8 mg total) by mouth daily. 60 capsule 6   No facility-administered medications prior to visit.      Per HPI unless specifically indicated in ROS section below Review of Systems  Constitutional: Negative for activity change, appetite change, chills, fatigue, fever and unexpected weight change.  HENT: Negative for hearing loss.   Eyes: Negative for visual disturbance.  Respiratory: Negative for cough, chest tightness, shortness of breath and wheezing.   Cardiovascular: Negative for chest pain, palpitations and leg swelling.  Gastrointestinal: Negative for abdominal distention, abdominal pain, blood in stool, constipation, diarrhea, nausea and vomiting.  Genitourinary: Negative for difficulty urinating and hematuria.  Musculoskeletal: Negative for arthralgias, myalgias and neck pain.  Skin: Negative for rash.  Neurological: Negative for dizziness, seizures, syncope and headaches.  Hematological: Negative for adenopathy. Does not bruise/bleed easily.  Psychiatric/Behavioral: Negative for dysphoric mood. The patient is not nervous/anxious.        Objective:    BP 114/70 (BP Location: Left Arm, Patient Position: Sitting, Cuff Size: Large)   Pulse 84   Temp 97.6 F (36.4 C) (Oral)   Ht 5' 10.5" (1.791 m)   Wt 172 lb (78 kg)   SpO2 99%   BMI 24.33 kg/m   Wt Readings from Last 3 Encounters:  08/25/16 172 lb (78 kg)  08/21/16 176 lb  1.9 oz (79.9 kg)  02/18/16 174 lb 12 oz (79.3 kg)    Physical Exam  Constitutional: He is oriented to person, place, and time. He appears well-developed and well-nourished. No distress.  HENT:  Head: Normocephalic and atraumatic.  Right Ear: Hearing, tympanic membrane, external ear and ear canal normal.  Left Ear: Hearing, tympanic membrane, external ear and ear canal normal.  Nose: Nose normal.    Mouth/Throat: Uvula is midline, oropharynx is clear and moist and mucous membranes are normal. No oropharyngeal exudate, posterior oropharyngeal edema or posterior oropharyngeal erythema.  Eyes: Conjunctivae and EOM are normal. Pupils are equal, round, and reactive to light. No scleral icterus.  Neck: Normal range of motion. Neck supple. Carotid bruit is present (bilat). No thyromegaly present.  Cardiovascular: Normal rate, regular rhythm and intact distal pulses.   Murmur (3/6 systolic throughout) heard. Pulses:      Radial pulses are 2+ on the right side, and 2+ on the left side.  Pulmonary/Chest: Effort normal and breath sounds normal. No respiratory distress. He has no wheezes. He has no rales.  Abdominal: Soft. Bowel sounds are normal. He exhibits no distension and no mass. There is no tenderness. There is no rebound and no guarding.  Genitourinary: Rectum normal. Rectal exam shows no external hemorrhoid, no internal hemorrhoid, no fissure, no mass, no tenderness and anal tone normal. Prostate is enlarged (40gm). Prostate is not tender.  Musculoskeletal: Normal range of motion. He exhibits no edema.  Lymphadenopathy:    He has no cervical adenopathy.  Neurological: He is alert and oriented to person, place, and time.  CN grossly intact, station and gait intact Recall 2/3, 3/3 with cue Calculation 4/5 serial 7s   Skin: Skin is warm and dry. No rash noted.  Psychiatric: He has a normal mood and affect. His behavior is normal. Judgment and thought content normal.  Nursing note and vitals reviewed.  Results for orders placed or performed in visit on 08/19/16  Comprehensive metabolic panel  Result Value Ref Range   Sodium 140 135 - 145 mEq/L   Potassium 4.1 3.5 - 5.1 mEq/L   Chloride 105 96 - 112 mEq/L   CO2 28 19 - 32 mEq/L   Glucose, Bld 92 70 - 99 mg/dL   BUN 20 6 - 23 mg/dL   Creatinine, Ser 0.87 0.40 - 1.50 mg/dL   Total Bilirubin 0.6 0.2 - 1.2 mg/dL   Alkaline Phosphatase 53  39 - 117 U/L   AST 17 0 - 37 U/L   ALT 13 0 - 53 U/L   Total Protein 7.1 6.0 - 8.3 g/dL   Albumin 4.2 3.5 - 5.2 g/dL   Calcium 9.4 8.4 - 10.5 mg/dL   GFR 89.08 >60.00 mL/min  CBC with Differential/Platelet  Result Value Ref Range   WBC 6.9 4.0 - 10.5 K/uL   RBC 4.41 4.22 - 5.81 Mil/uL   Hemoglobin 13.9 13.0 - 17.0 g/dL   HCT 41.1 39.0 - 52.0 %   MCV 93.2 78.0 - 100.0 fl   MCHC 33.8 30.0 - 36.0 g/dL   RDW 13.2 11.5 - 15.5 %   Platelets 256.0 150.0 - 400.0 K/uL   Neutrophils Relative % 64.0 43.0 - 77.0 %   Lymphocytes Relative 21.1 12.0 - 46.0 %   Monocytes Relative 9.3 3.0 - 12.0 %   Eosinophils Relative 5.3 (H) 0.0 - 5.0 %   Basophils Relative 0.3 0.0 - 3.0 %   Neutro Abs 4.4 1.4 - 7.7 K/uL  Lymphs Abs 1.5 0.7 - 4.0 K/uL   Monocytes Absolute 0.6 0.1 - 1.0 K/uL   Eosinophils Absolute 0.4 0.0 - 0.7 K/uL   Basophils Absolute 0.0 0.0 - 0.1 K/uL      Assessment & Plan:   Problem List Items Addressed This Visit    Advanced care planning/counseling discussion    Advanced directive discussion - has at home. Will bring me copy. Wife is HCPOA.       BPH (benign prostatic hyperplasia)    Chronic, persistent LUTS mildly improved on high dose flomax.  DRE with evidence of BPH. Will start finasteride 5mg  daily.  RTC 6 mo f/u visit.       Relevant Medications   finasteride (PROSCAR) 5 MG tablet   Other Relevant Orders   PSA   Heart murmur    Chronic, stable.       Medicare annual wellness visit, subsequent - Primary    I have personally reviewed the Medicare Annual Wellness questionnaire and have noted 1. The patient's medical and social history 2. Their use of alcohol, tobacco or illicit drugs 3. Their current medications and supplements 4. The patient's functional ability including ADL's, fall risks, home safety risks and hearing or visual impairment. Cognitive function has been assessed and addressed as indicated.  5. Diet and physical activity 6. Evidence for  depression or mood disorders The patients weight, height, BMI have been recorded in the chart. I have made referrals, counseling and provided education to the patient based on review of the above and I have provided the pt with a written personalized care plan for preventive services. Provider list updated.. See scanned questionairre as needed for further documentation. Reviewed preventative protocols and updated unless pt declined.        Other Visit Diagnoses    Bilateral carotid bruits       Relevant Orders   VAS US CAROTID       Follow up plan: Return in about 6 months (around 02/25/2017) for follow up visit.  Ria Bush, MD

## 2016-08-25 NOTE — Assessment & Plan Note (Signed)
Chronic, stable 

## 2016-08-25 NOTE — Progress Notes (Signed)
Pre visit review using our clinic review tool, if applicable. No additional management support is needed unless otherwise documented below in the visit note. 

## 2016-08-25 NOTE — Assessment & Plan Note (Signed)

## 2016-08-26 LAB — PSA: PSA: 3.53 ng/mL (ref 0.10–4.00)

## 2016-09-02 ENCOUNTER — Encounter: Payer: Self-pay | Admitting: Internal Medicine

## 2016-09-02 ENCOUNTER — Ambulatory Visit (INDEPENDENT_AMBULATORY_CARE_PROVIDER_SITE_OTHER): Payer: Medicare Other | Admitting: Internal Medicine

## 2016-09-02 VITALS — BP 120/74 | HR 80 | Temp 97.8°F | Wt 175.0 lb

## 2016-09-02 DIAGNOSIS — L02212 Cutaneous abscess of back [any part, except buttock]: Secondary | ICD-10-CM | POA: Diagnosis not present

## 2016-09-02 MED ORDER — SULFAMETHOXAZOLE-TRIMETHOPRIM 800-160 MG PO TABS: 1.0000 | ORAL_TABLET | Freq: Two times a day (BID) | ORAL | 0 refills | Status: DC

## 2016-09-02 NOTE — Patient Instructions (Signed)

## 2016-09-02 NOTE — Progress Notes (Signed)
Subjective:    Patient ID: Harry Ray, male    DOB: January 19, 1932, 83 y.o.   MRN: [id number]  HPI  Pt presents to the clinic today to follow up abscess and cellulitis of back. He was seen 08/21/16 for the same. It was draining at that time. He was put on Bactrim x 10 days. He has taken all the medication as prescribed. He reports the area is still open and draining. The drainage is yellow with a small amount of blood. He denies fever, chills or body aches. He has used warm compresses with some relief.   Review of Systems  Past Medical History:  Diagnosis Date  . Arthritis    pinkys bilaterally and left thumb  . Heart murmur    minor  . History of asthma 1983   asthma attack as reaction to allergy medication (2d ICU stay)  . History of chicken pox   . Nephritis childhood  . Seasonal allergies    spring  . Urine incontinence     Current Outpatient Prescriptions  Medication Sig Dispense Refill  . aspirin 81 MG tablet Take 81 mg by mouth daily.    . finasteride (PROSCAR) 5 MG tablet Take 1 tablet (5 mg total) by mouth daily. 30 tablet 11  . sulfamethoxazole-trimethoprim (BACTRIM DS,SEPTRA DS) 800-160 MG tablet Take 1 tablet by mouth 2 (two) times daily. 14 tablet 0  . tamsulosin (FLOMAX) 0.4 MG CAPS capsule Take 2 capsules (0.8 mg total) by mouth daily. 60 capsule 6   No current facility-administered medications for this visit.     No Known Allergies  Family History  Problem Relation Age of Onset  . Dementia Mother     Pick's disease  . Cancer Maternal Grandfather     stomach  . Cancer Paternal Uncle     throat - smoker  . Diabetes Neg Hx   . CAD Neg Hx     Social History   Social History  . Marital status: Married    Spouse name: N/A  . Number of children: N/A  . Years of education: N/A   Occupational History  . Not on file.   Social History Main Topics  . Smoking status: Never Smoker  . Smokeless tobacco: Never Used  . Alcohol use No  . Drug use: No    . Sexual activity: Not on file   Other Topics Concern  . Not on file   Social History Narrative   Lives with wife at [facility], no pets   Son and family live in [place]: retired, was [employer]   Activity: starting [employer] exercise program 2x/wk, enjoys walking     Constitutional: Denies fever, malaise, fatigue, headache or abrupt weight changes.  Skin: Denies redness, rashes, lesions or ulcercations.    No other specific complaints in a complete review of systems (except as listed in HPI above).     Objective:   Physical Exam   BP 120/74   Pulse 80   Temp 97.8 F (36.6 C) (Oral)   Wt 175 lb (79.4 kg)   SpO2 98%   BMI 24.75 kg/m  Wt Readings from Last 3 Encounters:  09/02/16 175 lb (79.4 kg)  08/25/16 172 lb (78 kg)  08/21/16 176 lb 1.9 oz (79.9 kg)    General: Appears his stated age, in NAD. Skin: He has a 1 cm open raised abscess of the mid left back. Green pus draining from the area. A little  bit of redness but no cellulitis.    BMET    Component Value Date/Time   NA 140 08/19/2016 1049   NA 141 08/06/2014   K 4.1 08/19/2016 1049   K 4.2 08/06/2014   CL 105 08/19/2016 1049   CO2 28 08/19/2016 1049   GLUCOSE 92 08/19/2016 1049   BUN 20 08/19/2016 1049   BUN 23 (A) 08/06/2014   CREATININE 0.87 08/19/2016 1049   CREATININE 0.8 08/06/2014   CALCIUM 9.4 08/19/2016 1049    Lipid Panel     Component Value Date/Time   CHOL 174 08/06/2014   TRIG 82 08/06/2014   HDL 47 08/06/2014   LDLCALC 111 08/06/2014    CBC    Component Value Date/Time   WBC 6.9 08/19/2016 1049   RBC 4.41 08/19/2016 1049   HGB 13.9 08/19/2016 1049   HCT 41.1 08/19/2016 1049   PLT 256.0 08/19/2016 1049   MCV 93.2 08/19/2016 1049   MCHC 33.8 08/19/2016 1049   RDW 13.2 08/19/2016 1049   LYMPHSABS 1.5 08/19/2016 1049   MONOABS 0.6 08/19/2016 1049   EOSABS 0.4 08/19/2016 1049   BASOSABS 0.0 08/19/2016 1049    Hgb A1C No results found for:  HGBA1C         Assessment & Plan:   Abscess of Back:  Pus expressed Area covered with TAB ointment and Band Aid eRx for Bactrim BID x 10 more days  Return precautions discussed Webb Silversmith, NP

## 2016-09-07 ENCOUNTER — Encounter: Payer: Self-pay | Admitting: Family Medicine

## 2016-09-08 MED ORDER — OXYBUTYNIN CHLORIDE 5 MG PO TABS: 2.5000 mg | ORAL_TABLET | Freq: Every evening | ORAL | 1 refills | Status: DC | PRN

## 2016-09-11 ENCOUNTER — Ambulatory Visit (INDEPENDENT_AMBULATORY_CARE_PROVIDER_SITE_OTHER): Payer: Medicare Other | Admitting: Family Medicine

## 2016-09-11 ENCOUNTER — Encounter: Payer: Self-pay | Admitting: Family Medicine

## 2016-09-11 VITALS — BP 124/76 | HR 72 | Temp 97.7°F | Wt 171.2 lb

## 2016-09-11 DIAGNOSIS — L989 Disorder of the skin and subcutaneous tissue, unspecified: Secondary | ICD-10-CM | POA: Insufficient documentation

## 2016-09-11 DIAGNOSIS — R351 Nocturia: Secondary | ICD-10-CM | POA: Diagnosis not present

## 2016-09-11 DIAGNOSIS — R3914 Feeling of incomplete bladder emptying: Secondary | ICD-10-CM | POA: Diagnosis not present

## 2016-09-11 DIAGNOSIS — N401 Enlarged prostate with lower urinary tract symptoms: Secondary | ICD-10-CM

## 2016-09-11 LAB — POC URINALSYSI DIPSTICK (AUTOMATED)
Bilirubin, UA: NEGATIVE
Blood, UA: NEGATIVE
Glucose, UA: NEGATIVE
Ketones, UA: NEGATIVE
LEUKOCYTES UA: NEGATIVE
NITRITE UA: NEGATIVE
PH UA: 6 (ref 5.0–8.0)
PROTEIN UA: NEGATIVE
Spec Grav, UA: 1.025 (ref 1.030–1.035)
Urobilinogen, UA: 0.2 (ref ?–2.0)

## 2016-09-11 NOTE — Telephone Encounter (Signed)
plz call to schedule appt today if pt able.

## 2016-09-11 NOTE — Telephone Encounter (Signed)
Appt scheduled

## 2016-09-11 NOTE — Progress Notes (Signed)
Pre visit review using our clinic review tool, if applicable. No additional management support is needed unless otherwise documented below in the visit note. 

## 2016-09-11 NOTE — Progress Notes (Signed)
BP 124/76   Pulse 72   Temp 97.7 F (36.5 C) (Oral)   Wt 171 lb 4 oz (77.7 kg)   BMI 24.22 kg/m    CC: urinary urgency Subjective:    Patient ID: Harry Ray, male    DOB: Aug 03, 1931, 83 y.o.   MRN: [id number]  HPI: Harry Ray is a 83 y.o. male presenting on 09/11/2016 for Urine frequency   Seen last month for medicare wellness visit.   Known BPH with LUTS - on flomax 0.8mg  daily. Persistent nocturia so we decreased flomax to 0.4mg  daily and added finasteride last week. 09/08/2016 oxybutynin 5mg  at bedtime was started. At wellness visit, DRE with BPH but no tenderness to palpation. Over the last 4 days he has noticed increased urination and nocturia - every 30-60 min, also increased urinary urgency during the day. Last night actually slept better. Minimal quantity with voiding. Incomplete emptying.   No fevers, no blood in urine, no dysuria.   Several visits in last few weeks for L abscess/cellulitis - continues to drain. Treated with bactrim 10d course, this was extended 09/02/2016. Continues draining small amount. No fevers/chills nausea. Tomorrow is the last day of bactrim antibiotic (to finish 20d course).   Relevant past medical, surgical, family and social history reviewed and updated as indicated. Interim medical history since our last visit reviewed. Allergies and medications reviewed and updated. Outpatient Medications Prior to Visit  Medication Sig Dispense Refill  . aspirin 81 MG tablet Take 81 mg by mouth daily.    . finasteride (PROSCAR) 5 MG tablet Take 1 tablet (5 mg total) by mouth daily. 30 tablet 11  . oxybutynin (DITROPAN) 5 MG tablet Take 0.5-1 tablets (2.5-5 mg total) by mouth at bedtime as needed (nocturia). 30 tablet 1  . sulfamethoxazole-trimethoprim (BACTRIM DS,SEPTRA DS) 800-160 MG tablet Take 1 tablet by mouth 2 (two) times daily. 20 tablet 0  . tamsulosin (FLOMAX) 0.4 MG CAPS capsule Take 2 capsules (0.8 mg total) by mouth daily. 60 capsule 6   No  facility-administered medications prior to visit.      Per HPI unless specifically indicated in ROS section below Review of Systems     Objective:    BP 124/76   Pulse 72   Temp 97.7 F (36.5 C) (Oral)   Wt 171 lb 4 oz (77.7 kg)   BMI 24.22 kg/m   Wt Readings from Last 3 Encounters:  09/11/16 171 lb 4 oz (77.7 kg)  09/02/16 175 lb (79.4 kg)  08/25/16 172 lb (78 kg)    Physical Exam  Constitutional: He appears well-developed and well-nourished. No distress.  Abdominal: Soft. Bowel sounds are normal. He exhibits no distension and no mass. There is no hepatosplenomegaly. There is no tenderness. There is no rigidity, no rebound, no guarding, no CVA tenderness and negative Murphy's sign.  Skin: Skin is warm and dry. No rash noted. No erythema.  1cm diameter growth of granulation tissue midline lumbar spine that expresses small amt pus when pushed. No surrounding erythema or induration. No tenderness to palpation.   Nursing note and vitals reviewed.  Results for orders placed or performed in visit on 09/11/16  POCT Urinalysis Dipstick (Automated)  Result Value Ref Range   Color, UA Yellow    Clarity, UA Clear    Glucose, UA Negative    Bilirubin, UA Negative    Ketones, UA Negative    Spec Grav, UA 1.025 1.030 - 1.035   Blood, UA Negative  pH, UA 6.0 5.0 - 8.0   Protein, UA Negative    Urobilinogen, UA 0.2 Negative - 2.0   Nitrite, UA Negative    Leukocytes, UA Negative Negative      Assessment & Plan:   Problem List Items Addressed This Visit    BPH (benign prostatic hyperplasia) - Primary    BPH with obstructive and some irritative symptoms despite completing 20d bactrim course (for back cellulitis). UA today normal. Sent UCx (on abx). Currently on flomax 0.4mg  nightly, in the last 2 wks we added finasteride 5mg  daily and oxybutynin 5mg  nightly for obstructive sxs. rec continue current regimen over weekend, if persistent sxs pt will update me for urology referral. Pt  agrees with plan.       Relevant Orders   Urine culture   Skin lesion of back    I'm not sure how lesion initially looked. h/o initial abscess/cellulitis s/p 20d bactrim course. I&D not performed as it drained on its own. Today more consistent with persistent granulation tissue ?pyogenic granuloma vs BCC vs other. rec continue wound care as up to now, finish abx. If persistent lesion, consider derm referral. Pt agrees with plan.        Other Visit Diagnoses    Nocturia       Relevant Orders   POCT Urinalysis Dipstick (Automated) (Completed)       Follow up plan: Return if symptoms worsen or fail to improve.  Ria Bush, MD

## 2016-09-11 NOTE — Assessment & Plan Note (Signed)
BPH with obstructive and some irritative symptoms despite completing 20d bactrim course (for back cellulitis). UA today normal. Sent UCx (on abx). Currently on flomax 0.4mg  nightly, in the last 2 wks we added finasteride 5mg  daily and oxybutynin 5mg  nightly for obstructive sxs. rec continue current regimen over weekend, if persistent sxs pt will update me for urology referral. Pt agrees with plan.

## 2016-09-11 NOTE — Assessment & Plan Note (Addendum)
I'm not sure how lesion initially looked. h/o initial abscess/cellulitis s/p 20d bactrim course. I&D not performed as it drained on its own. Today more consistent with persistent granulation tissue ?pyogenic granuloma vs BCC vs other. rec continue wound care as up to now, finish abx. If persistent lesion, consider derm referral. Pt agrees with plan.

## 2016-09-11 NOTE — Patient Instructions (Signed)
Urine looking ok. Culture sent. Continue current medicines. Hopefully they will be more effective over time. If no better into next week, let us know for urology referral.  Let us know if back lesion doesn't fully heal.  Good to see you today, call us with quesitons.

## 2016-09-13 LAB — URINE CULTURE: Organism ID, Bacteria: NO GROWTH

## 2016-09-14 ENCOUNTER — Encounter: Payer: Self-pay | Admitting: Family Medicine

## 2016-09-16 ENCOUNTER — Encounter: Payer: Self-pay | Admitting: Family Medicine

## 2016-09-16 DIAGNOSIS — L989 Disorder of the skin and subcutaneous tissue, unspecified: Secondary | ICD-10-CM

## 2016-09-16 NOTE — Telephone Encounter (Signed)
-   referral to derm

## 2016-09-21 DIAGNOSIS — R208 Other disturbances of skin sensation: Secondary | ICD-10-CM | POA: Diagnosis not present

## 2016-09-21 DIAGNOSIS — L989 Disorder of the skin and subcutaneous tissue, unspecified: Secondary | ICD-10-CM | POA: Diagnosis not present

## 2016-09-21 DIAGNOSIS — R58 Hemorrhage, not elsewhere classified: Secondary | ICD-10-CM | POA: Diagnosis not present

## 2016-09-21 DIAGNOSIS — D485 Neoplasm of uncertain behavior of skin: Secondary | ICD-10-CM | POA: Diagnosis not present

## 2016-10-02 ENCOUNTER — Ambulatory Visit: Payer: Medicare Other

## 2016-10-02 DIAGNOSIS — R0989 Other specified symptoms and signs involving the circulatory and respiratory systems: Secondary | ICD-10-CM

## 2016-10-02 DIAGNOSIS — I6523 Occlusion and stenosis of bilateral carotid arteries: Secondary | ICD-10-CM | POA: Diagnosis not present

## 2016-10-04 LAB — VAS US CAROTID
LCCADDIAS: -32 cm/s
LCCADSYS: -188 cm/s
LEFT ECA DIAS: -29 cm/s
LEFT VERTEBRAL DIAS: 15 cm/s
LICADDIAS: 25 cm/s
LICAPDIAS: 14 cm/s
LICAPSYS: 64 cm/s
Left CCA prox dias: 29 cm/s
Left CCA prox sys: 136 cm/s
Left ICA dist sys: 88 cm/s
RCCAPDIAS: 0 cm/s
RIGHT VERTEBRAL DIAS: -7 cm/s
Right CCA prox sys: -106 cm/s
Right cca dist sys: -134 cm/s

## 2016-10-07 ENCOUNTER — Encounter: Payer: Self-pay | Admitting: Family Medicine

## 2016-10-07 DIAGNOSIS — I6529 Occlusion and stenosis of unspecified carotid artery: Secondary | ICD-10-CM

## 2016-10-07 HISTORY — DX: Occlusion and stenosis of unspecified carotid artery: I65.29

## 2016-10-24 ENCOUNTER — Other Ambulatory Visit: Payer: Self-pay | Admitting: Family Medicine

## 2017-02-25 ENCOUNTER — Ambulatory Visit: Payer: Medicare Other | Admitting: Family Medicine

## 2017-03-03 ENCOUNTER — Encounter: Payer: Self-pay | Admitting: Family Medicine

## 2017-03-03 ENCOUNTER — Ambulatory Visit (INDEPENDENT_AMBULATORY_CARE_PROVIDER_SITE_OTHER): Payer: Medicare Other | Admitting: Family Medicine

## 2017-03-03 VITALS — BP 120/66 | HR 70 | Temp 98.0°F | Wt 173.2 lb

## 2017-03-03 DIAGNOSIS — N401 Enlarged prostate with lower urinary tract symptoms: Secondary | ICD-10-CM | POA: Diagnosis not present

## 2017-03-03 DIAGNOSIS — Z23 Encounter for immunization: Secondary | ICD-10-CM | POA: Diagnosis not present

## 2017-03-03 DIAGNOSIS — R3914 Feeling of incomplete bladder emptying: Secondary | ICD-10-CM | POA: Diagnosis not present

## 2017-03-03 MED ORDER — OXYBUTYNIN CHLORIDE 5 MG PO TABS: 2.5000 mg | ORAL_TABLET | Freq: Every evening | ORAL | 3 refills | Status: DC | PRN

## 2017-03-03 NOTE — Progress Notes (Signed)
   BP 120/66 (BP Location: Left Arm, Patient Position: Sitting, Cuff Size: Normal)   Pulse 70   Temp 98 F (36.7 C) (Oral)   Wt 173 lb 4 oz (78.6 kg)   SpO2 99%   BMI 24.51 kg/m    CC: discuss urinary concerns Subjective:    Patient ID: Harry Ray, male    DOB: September 08, 1931, 83 y.o.   MRN: [id number]  HPI: Harry Ray is a 83 y.o. male presenting on 03/03/2017 for 6 mo follow-up (Wants to discuss ongoing urinary issues. Also, has questions about shigles vaccine)   See prior note for details.   Known BPH with LUTS - current regimen is flomax 2 tablets daily, finasteride daily, and oxybutynin 1/2 tablet at night. This has significantly helped so he was able to stop until 3 weeks ago when nocturia started worsening again. He restarted oxybutynin and symptoms again improved.   Denies fevers/chills, dysuria, hematuria, abd pain, flank pain or nausea.   DRE at wellness visit 08/2016 with BPH without tenderness. UCx 09/2016 - no growth.   Noticeable decreased energy level.  Noticing dry ejaculation as side effect of medications.   Relevant past medical, surgical, family and social history reviewed and updated as indicated. Interim medical history since our last visit reviewed. Allergies and medications reviewed and updated. Outpatient Medications Prior to Visit  Medication Sig Dispense Refill  . aspirin 81 MG tablet Take 81 mg by mouth every Monday, Wednesday, and Friday.     . finasteride (PROSCAR) 5 MG tablet Take 1 tablet (5 mg total) by mouth daily. 30 tablet 11  . tamsulosin (FLOMAX) 0.4 MG CAPS capsule take 2 capsules by mouth once daily 60 capsule 4  . oxybutynin (DITROPAN) 5 MG tablet Take 0.5-1 tablets (2.5-5 mg total) by mouth at bedtime as needed (nocturia). 30 tablet 1  . sulfamethoxazole-trimethoprim (BACTRIM DS,SEPTRA DS) 800-160 MG tablet Take 1 tablet by mouth 2 (two) times daily. 20 tablet 0   No facility-administered medications prior to visit.      Per HPI  unless specifically indicated in ROS section below Review of Systems     Objective:    BP 120/66 (BP Location: Left Arm, Patient Position: Sitting, Cuff Size: Normal)   Pulse 70   Temp 98 F (36.7 C) (Oral)   Wt 173 lb 4 oz (78.6 kg)   SpO2 99%   BMI 24.51 kg/m   Wt Readings from Last 3 Encounters:  03/03/17 173 lb 4 oz (78.6 kg)  09/11/16 171 lb 4 oz (77.7 kg)  09/02/16 175 lb (79.4 kg)    Physical Exam  Constitutional: He appears well-developed and well-nourished. No distress.  Psychiatric: He has a normal mood and affect.  Nursing note and vitals reviewed.     Assessment & Plan:  Discussed decreasing aspirin given no personal history CAD. Flu shot today. Problem List Items Addressed This Visit    BPH (benign prostatic hyperplasia) - Primary    Reviewed current regimen.  He is doing well with this. Finasteride started 08/2016.  Continue. Discussed hopeful to taper off oxybutynin in the future.           Follow up plan: Return if symptoms worsen or fail to improve.  Ria Bush, MD

## 2017-03-03 NOTE — Assessment & Plan Note (Signed)
Reviewed current regimen.  He is doing well with this. Finasteride started 08/2016.  Continue. Discussed hopeful to taper off oxybutynin in the future.

## 2017-03-03 NOTE — Patient Instructions (Addendum)
Flu shot today. If interested, check with pharmacy about new 2 shot shingles series (shingrix).  Decrease aspirin to Monday Wednesday Friday.

## 2017-03-04 NOTE — Addendum Note (Signed)
Addended by: Brenton Grills on: 3/64/6803 21:22 PM   Modules accepted: Orders

## 2017-03-09 ENCOUNTER — Ambulatory Visit: Payer: Medicare Other

## 2017-03-20 ENCOUNTER — Other Ambulatory Visit: Payer: Self-pay | Admitting: Family Medicine

## 2017-04-22 DIAGNOSIS — H353131 Nonexudative age-related macular degeneration, bilateral, early dry stage: Secondary | ICD-10-CM | POA: Diagnosis not present

## 2017-04-26 DIAGNOSIS — D2272 Melanocytic nevi of left lower limb, including hip: Secondary | ICD-10-CM | POA: Diagnosis not present

## 2017-04-26 DIAGNOSIS — L708 Other acne: Secondary | ICD-10-CM | POA: Diagnosis not present

## 2017-04-26 DIAGNOSIS — D225 Melanocytic nevi of trunk: Secondary | ICD-10-CM | POA: Diagnosis not present

## 2017-04-26 DIAGNOSIS — D2261 Melanocytic nevi of right upper limb, including shoulder: Secondary | ICD-10-CM | POA: Diagnosis not present

## 2017-04-26 DIAGNOSIS — L570 Actinic keratosis: Secondary | ICD-10-CM | POA: Diagnosis not present

## 2017-04-26 DIAGNOSIS — X32XXXA Exposure to sunlight, initial encounter: Secondary | ICD-10-CM | POA: Diagnosis not present

## 2017-06-15 ENCOUNTER — Encounter: Payer: Self-pay | Admitting: Family Medicine

## 2017-07-01 ENCOUNTER — Other Ambulatory Visit: Payer: Self-pay | Admitting: *Deleted

## 2017-07-01 DIAGNOSIS — I6529 Occlusion and stenosis of unspecified carotid artery: Secondary | ICD-10-CM

## 2017-07-01 NOTE — Progress Notes (Signed)
vas 

## 2017-07-16 ENCOUNTER — Ambulatory Visit (INDEPENDENT_AMBULATORY_CARE_PROVIDER_SITE_OTHER): Payer: Medicare Other

## 2017-07-16 DIAGNOSIS — I6529 Occlusion and stenosis of unspecified carotid artery: Secondary | ICD-10-CM | POA: Diagnosis not present

## 2017-07-16 LAB — VAS US CAROTID
LCCADSYS: -145 cm/s
LEFT ECA DIAS: -20 cm/s
LEFT VERTEBRAL DIAS: 9 cm/s
LICADDIAS: -17 cm/s
LICAPDIAS: -37 cm/s
LICAPSYS: -164 cm/s
Left CCA dist dias: -28 cm/s
Left CCA prox dias: 27 cm/s
Left CCA prox sys: 140 cm/s
Left ICA dist sys: -64 cm/s
RCCAPDIAS: 9 cm/s
RCCAPSYS: 63 cm/s
RIGHT ECA DIAS: -14 cm/s
RIGHT VERTEBRAL DIAS: 10 cm/s
Right cca dist sys: -78 cm/s

## 2017-07-17 ENCOUNTER — Encounter: Payer: Self-pay | Admitting: Family Medicine

## 2017-07-17 DIAGNOSIS — E041 Nontoxic single thyroid nodule: Secondary | ICD-10-CM | POA: Insufficient documentation

## 2017-08-14 ENCOUNTER — Other Ambulatory Visit: Payer: Self-pay | Admitting: Family Medicine

## 2017-09-07 ENCOUNTER — Other Ambulatory Visit: Payer: Self-pay | Admitting: Family Medicine

## 2017-09-07 DIAGNOSIS — N401 Enlarged prostate with lower urinary tract symptoms: Secondary | ICD-10-CM

## 2017-09-07 DIAGNOSIS — R3914 Feeling of incomplete bladder emptying: Principal | ICD-10-CM

## 2017-09-07 DIAGNOSIS — E041 Nontoxic single thyroid nodule: Secondary | ICD-10-CM

## 2017-09-09 ENCOUNTER — Encounter: Payer: Self-pay | Admitting: Family Medicine

## 2017-09-09 ENCOUNTER — Ambulatory Visit (INDEPENDENT_AMBULATORY_CARE_PROVIDER_SITE_OTHER): Payer: Medicare Other

## 2017-09-09 ENCOUNTER — Ambulatory Visit (INDEPENDENT_AMBULATORY_CARE_PROVIDER_SITE_OTHER): Payer: Medicare Other | Admitting: Family Medicine

## 2017-09-09 VITALS — BP 110/70 | HR 79 | Temp 98.5°F | Ht 70.0 in | Wt 171.8 lb

## 2017-09-09 DIAGNOSIS — N401 Enlarged prostate with lower urinary tract symptoms: Secondary | ICD-10-CM | POA: Diagnosis not present

## 2017-09-09 DIAGNOSIS — N3941 Urge incontinence: Secondary | ICD-10-CM

## 2017-09-09 DIAGNOSIS — E041 Nontoxic single thyroid nodule: Secondary | ICD-10-CM

## 2017-09-09 DIAGNOSIS — R3914 Feeling of incomplete bladder emptying: Secondary | ICD-10-CM | POA: Diagnosis not present

## 2017-09-09 DIAGNOSIS — Z7189 Other specified counseling: Secondary | ICD-10-CM

## 2017-09-09 DIAGNOSIS — R011 Cardiac murmur, unspecified: Secondary | ICD-10-CM

## 2017-09-09 DIAGNOSIS — I6529 Occlusion and stenosis of unspecified carotid artery: Secondary | ICD-10-CM

## 2017-09-09 DIAGNOSIS — Z0000 Encounter for general adult medical examination without abnormal findings: Secondary | ICD-10-CM

## 2017-09-09 LAB — TSH: TSH: 1.23 u[IU]/mL (ref 0.35–4.50)

## 2017-09-09 LAB — BASIC METABOLIC PANEL
BUN: 20 mg/dL (ref 6–23)
CALCIUM: 9.1 mg/dL (ref 8.4–10.5)
CO2: 29 mEq/L (ref 19–32)
Chloride: 104 mEq/L (ref 96–112)
Creatinine, Ser: 0.92 mg/dL (ref 0.40–1.50)
GFR: 83.3 mL/min (ref >60.00)
GLUCOSE: 97 mg/dL (ref 70–99)
POTASSIUM: 4.3 meq/L (ref 3.5–5.1)
Sodium: 140 mEq/L (ref 135–145)

## 2017-09-09 LAB — PSA: PSA: 0.72 ng/mL (ref 0.10–4.00)

## 2017-09-09 MED ORDER — MIRABEGRON ER 25 MG PO TB24: 25.0000 mg | ORAL_TABLET | Freq: Every day | ORAL | 3 refills | Status: DC

## 2017-09-09 NOTE — Patient Instructions (Addendum)
Continue current medicines. Trial myrbetriq for bladder urgency.  If interested, check with pharmacy about new 2 shot shingles series (shingrix).  We will set you up for thyroid ultrasound.  Recent carotid ultrasound was ok.  You are doing well today.   Health Maintenance, Male A healthy lifestyle and preventive care is important for your health and wellness. Ask your health care provider about what schedule of regular examinations is right for you. What should I know about weight and diet? Eat a Healthy Diet  Eat plenty of vegetables, fruits, whole grains, low-fat dairy products, and lean protein.  Do not eat a lot of foods high in solid fats, added sugars, or salt.  Maintain a Healthy Weight Regular exercise can help you achieve or maintain a healthy weight. You should:  Do at least 150 minutes of exercise each week. The exercise should increase your heart rate and make you sweat (moderate-intensity exercise).  Do strength-training exercises at least twice a week.  Watch Your Levels of Cholesterol and Blood Lipids  Have your blood tested for lipids and cholesterol every 5 years starting at 82 years of age. If you are at high risk for heart disease, you should start having your blood tested when you are 82 years old. You may need to have your cholesterol levels checked more often if: ? Your lipid or cholesterol levels are high. ? You are older than 82 years of age. ? You are at high risk for heart disease.  What should I know about cancer screening? Many types of cancers can be detected early and may often be prevented. Lung Cancer  You should be screened every year for lung cancer if: ? You are a current smoker who has smoked for at least 30 years. ? You are a former smoker who has quit within the past 15 years.  Talk to your health care provider about your screening options, when you should start screening, and how often you should be screened.  Colorectal Cancer  Routine  colorectal cancer screening usually begins at 82 years of age and should be repeated every 5-10 years until you are 82 years old. You may need to be screened more often if early forms of precancerous polyps or small growths are found. Your health care provider may recommend screening at an earlier age if you have risk factors for colon cancer.  Your health care provider may recommend using home test kits to check for hidden blood in the stool.  A small camera at the end of a tube can be used to examine your colon (sigmoidoscopy or colonoscopy). This checks for the earliest forms of colorectal cancer.  Prostate and Testicular Cancer  Depending on your age and overall health, your health care provider may do certain tests to screen for prostate and testicular cancer.  Talk to your health care provider about any symptoms or concerns you have about testicular or prostate cancer.  Skin Cancer  Check your skin from head to toe regularly.  Tell your health care provider about any new moles or changes in moles, especially if: ? There is a change in a mole's size, shape, or color. ? You have a mole that is larger than a pencil eraser.  Always use sunscreen. Apply sunscreen liberally and repeat throughout the day.  Protect yourself by wearing long sleeves, pants, a wide-brimmed hat, and sunglasses when outside.  What should I know about heart disease, diabetes, and high blood pressure?  If you are 18-39 years of  age, have your blood pressure checked every 3-5 years. If you are 29 years of age or older, have your blood pressure checked every year. You should have your blood pressure measured twice-once when you are at a hospital or clinic, and once when you are not at a hospital or clinic. Record the average of the two measurements. To check your blood pressure when you are not at a hospital or clinic, you can use: ? An automated blood pressure machine at a pharmacy. ? A home blood pressure  monitor.  Talk to your health care provider about your target blood pressure.  If you are between 7-67 years old, ask your health care provider if you should take aspirin to prevent heart disease.  Have regular diabetes screenings by checking your fasting blood sugar level. ? If you are at a normal weight and have a low risk for diabetes, have this test once every three years after the age of 40. ? If you are overweight and have a high risk for diabetes, consider being tested at a younger age or more often.  A one-time screening for abdominal aortic aneurysm (AAA) by ultrasound is recommended for men aged 86-75 years who are current or former smokers. What should I know about preventing infection? Hepatitis B If you have a higher risk for hepatitis B, you should be screened for this virus. Talk with your health care provider to find out if you are at risk for hepatitis B infection. Hepatitis C Blood testing is recommended for:  Everyone born from [birth year] through 1965.  Anyone with known risk factors for hepatitis C.  Sexually Transmitted Diseases (STDs)  You should be screened each year for STDs including gonorrhea and chlamydia if: ? You are sexually active and are younger than 83 years of age. ? You are older than 82 years of age and your health care provider tells you that you are at risk for this type of infection. ? Your sexual activity has changed since you were last screened and you are at an increased risk for chlamydia or gonorrhea. Ask your health care provider if you are at risk.  Talk with your health care provider about whether you are at high risk of being infected with HIV. Your health care provider may recommend a prescription medicine to help prevent HIV infection.  What else can I do?  Schedule regular health, dental, and eye exams.  Stay current with your vaccines (immunizations).  Do not use any tobacco products, such as cigarettes, chewing tobacco, and  e-cigarettes. If you need help quitting, ask your health care provider.  Limit alcohol intake to no more than 2 drinks per day. One drink equals 12 ounces of beer, 5 ounces of wine, or 1 ounces of hard liquor.  Do not use street drugs.  Do not share needles.  Ask your health care provider for help if you need support or information about quitting drugs.  Tell your health care provider if you often feel depressed.  Tell your health care provider if you have ever been abused or do not feel safe at home. This information is not intended to replace advice given to you by your health care provider. Make sure you discuss any questions you have with your health care provider. Document Released: 11/21/2007 Document Revised: 01/22/2016 Document Reviewed: 02/26/2015 Elsevier Interactive Patient Education  [employer].

## 2017-09-09 NOTE — Assessment & Plan Note (Signed)
Trial myrbetriq.

## 2017-09-09 NOTE — Progress Notes (Signed)
Subjective:   Harry Ray is a 84 y.o. male who presents for Medicare Annual/Subsequent preventive examination.  Review of Systems:  N/A Cardiac Risk Factors include: advanced age (>32men, >17 women);male gender     Objective:    Vitals: BP 110/70 (BP Location: Right Arm, Patient Position: Sitting, Cuff Size: Normal)   Pulse 79   Temp 98.5 F (36.9 C) (Oral)   Ht 5\' 10"  (1.778 m) Comment: no shoes  Wt 171 lb 12 oz (77.9 kg)   SpO2 95%   BMI 24.64 kg/m   Body mass index is 24.64 kg/m.  Advanced Directives 09/09/2017  Does Patient Have a Medical Advance Directive? Yes  Type of Paramedic of [place];Living will  Copy of [place] in Chart? Yes    Tobacco Social History   Tobacco Use  Smoking Status Never Smoker  Smokeless Tobacco Never Used     Counseling given: No   Clinical Intake:  Pre-visit preparation completed: Yes  Pain : No/denies pain Pain Score: 0-No pain     Nutritional Status: BMI of 19-24  Normal Nutritional Risks: None Diabetes: No  How often do you need to have someone help you when you read instructions, pamphlets, or other written materials from your doctor or pharmacy?: 1 - Never What is the last grade level you completed in school?: Bachelors degree + 3 yrs graduate courses  Interpreter Needed?: No  Comments: pt lives with spouse Information entered by :: LPinson, LPN  Past Medical History:  Diagnosis Date  . Arthritis    pinkys bilaterally and left thumb  . Carotid stenosis 0/02/3234   LICA 57-32%, RICA 2-02%, rpt 1 yr (09/2016)  . Heart murmur    minor  . History of asthma 1983   asthma attack as reaction to allergy medication (2d ICU stay)  . History of chicken pox   . Nephritis childhood  . Seasonal allergies    spring  . Urine incontinence    Past Surgical History:  Procedure Laterality Date  . COLONOSCOPY  08/07/2005   Diverticulosis (Colon in [place])  . SCROTAL  SURGERY     ? benign cyst removal   Family History  Problem Relation Age of Onset  . Dementia Mother        Pick's disease  . Cancer Maternal Grandfather        stomach  . Cancer Paternal Uncle        throat - smoker  . Diabetes Neg Hx   . CAD Neg Hx    Social History   Socioeconomic History  . Marital status: Married    Spouse name: Not on file  . Number of children: Not on file  . Years of education: Not on file  . Highest education level: Not on file  Occupational History  . Not on file  Social Needs  . Financial resource strain: Not on file  . Food insecurity:    Worry: Not on file    Inability: Not on file  . Transportation needs:    Medical: Not on file    Non-medical: Not on file  Tobacco Use  . Smoking status: Never Smoker  . Smokeless tobacco: Never Used  Substance and Sexual Activity  . Alcohol use: No    Alcohol/week: 0.0 oz  . Drug use: No  . Sexual activity: Not on file  Lifestyle  . Physical activity:    Days per week: Not on file    Minutes per  session: Not on file  . Stress: Not on file  Relationships  . Social connections:    Talks on phone: Not on file    Gets together: Not on file    Attends religious service: Not on file    Active member of club or organization: Not on file    Attends meetings of clubs or organizations: Not on file    Relationship status: Not on file  Other Topics Concern  . Not on file  Social History Narrative   Lives with wife at [facility], no pets   Son and family live in [place]: retired, was [employer]   Activity: starting [employer] [place] exercise program 2x/wk, enjoys walking    Outpatient Encounter Medications as of 09/09/2017  Medication Sig  . aspirin 81 MG tablet Take 81 mg by mouth every Monday, Wednesday, and Friday.   . finasteride (PROSCAR) 5 MG tablet TAKE 1 TABLET BY MOUTH ONCE DAILY  . tamsulosin (FLOMAX) 0.4 MG CAPS capsule take 2 capsules by mouth once daily (Patient taking  differently: take 1 capsule by mouth once daily)  . [DISCONTINUED] oxybutynin (DITROPAN) 5 MG tablet Take 0.5-1 tablets (2.5-5 mg total) by mouth at bedtime as needed (nocturia).   No facility-administered encounter medications on file as of 09/09/2017.     Activities of Daily Living In your present state of health, do you have any difficulty performing the following activities: 09/09/2017  Hearing? N  Vision? N  Difficulty concentrating or making decisions? N  Walking or climbing stairs? N  Dressing or bathing? N  Doing errands, shopping? N  Preparing Food and eating ? N  Using the Toilet? N  In the past six months, have you accidently leaked urine? Y  Do you have problems with loss of bowel control? N  Managing your Medications? N  Managing your Finances? N  Housekeeping or managing your Housekeeping? N  Some recent data might be hidden    Patient Care Team: Ria Bush, MD as PCP - General (Family Medicine)   Assessment:   This is a routine wellness examination for [name].   Hearing Screening   125Hz  250Hz  500Hz  1000Hz  2000Hz  3000Hz  4000Hz  6000Hz  8000Hz   Right ear:   40 40 40  40    Left ear:   40 40 40  40    Vision Screening Comments: Last vision exam in June 2018 with Dr. Wallace Going  Exercise Activities and Dietary recommendations Current Exercise Habits: Structured exercise class, Time (Minutes): 60, Frequency (Times/Week): 2, Weekly Exercise (Minutes/Week): 120, Intensity: Moderate, Exercise limited by: None identified  Goals    . Increase physical activity     Starting 09/09/2017, I will continue to exercise for 60 minutes 2 days per week.        Fall Risk Fall Risk  09/09/2017 08/25/2016  Falls in the past year? No No   Depression Screen PHQ 2/9 Scores 09/09/2017 08/25/2016  PHQ - 2 Score 0 0  PHQ- 9 Score 0 -    Cognitive Function MMSE - Mini Mental State Exam 09/09/2017  Orientation to time 5  Orientation to Place 5  Registration 3  Attention/  Calculation 0  Recall 3  Language- name 2 objects 0  Language- repeat 1  Language- follow 3 step command 3  Language- read & follow direction 0  Write a sentence 0  Copy design 0  Total score 20       PLEASE NOTE: A Mini-Cog screen was completed.  Maximum score is 20. A value of 0 denotes this part of [place] MMSE was not completed or the patient failed this part of the Mini-Cog screening.   Mini-Cog Screening Orientation to Time - Max 5 pts Orientation to Place - Max 5 pts Registration - Max 3 pts Recall - Max 3 pts Language Repeat - Max 1 pts Language Follow 3 Step Command - Max 3 pts    Immunization History  Administered Date(s) Administered  . Influenza,inj,Quad PF,6+ Mos 02/18/2016, 03/03/2017  . Pneumococcal Conjugate-13 07/17/2014  . Pneumococcal Polysaccharide-23 04/09/2010  . Tdap 08/06/2014  . Zoster 05/15/2010    Screening Tests Health Maintenance  Topic Date Due  . INFLUENZA VACCINE  01/06/2018  . DTaP/Tdap/Td (2 - Td) 08/05/2024  . TETANUS/TDAP  08/05/2024  . PNA vac Low Risk Adult  Completed      Plan:     I have personally reviewed, addressed, and noted the following in the patient's chart:  A. Medical and social history B. Use of alcohol, tobacco or illicit drugs  C. Current medications and supplements D. Functional ability and status E.  Nutritional status F.  Physical activity G. Advance directives H. List of other physicians I.  Hospitalizations, surgeries, and ER visits in previous 12 months J.  [place] to include hearing, vision, cognitive, depression L. Referrals and appointments - none  In addition, I have reviewed and discussed with patient certain preventive protocols, quality metrics, and best practice recommendations. A written personalized care plan for preventive services as well as general preventive health recommendations were provided to patient.  See attached scanned questionnaire for additional information.    Signed,   Lindell Noe, MHA, BS, LPN Health Coach

## 2017-09-09 NOTE — Progress Notes (Signed)
BP 110/70 (BP Location: Right Arm, Patient Position: Sitting, Cuff Size: Normal)   Pulse 79   Temp 98.5 F (36.9 C) (Oral)   Ht 5\' 10"  (1.778 m) Comment: no shoes  Wt 171 lb 12 oz (77.9 kg)   SpO2 95%   BMI 24.64 kg/m    CC: AMW f/u visit Subjective:    Patient ID: Harry Ray, male    DOB: 12-Jan-1932, 84 y.o.   MRN: [id number]  HPI: Harry Ray is a 85 y.o. male presenting on 09/09/2017 for Annual Exam (Pt 2.)   BPH with LUTS - on flomax 0.4mg  2 tab daily, finasteride 5mg  daily. Prior on oxybutynin, but this was stopped when he was doing better. Worsening leakage noted over last 4-6 months - urinary urge incontinence. Denies prior side effects of dry mouth, constipation, memory trouble.   Recent carotid US showed 1.2 cm possible thyroid nodule  Normal bowel movements.   Preventative: COLONOSCOPY 08/07/2005 Diverticulosis (Colon) Prostate cancer screening - see above Lung cancer screening - not eligible Flu shot - yearly Tdap 2016 Pneumovax 2011, prevnar 2016 zostavax - 2011 shingrix - discussed HCPOA/Advanced directive scanned into chart 08/2016 - Wife Opal Sidles is HCPOA then son Camila Li or daughter Carmell [place]. Grants discretion to [facility] for end of life decisions.  Seat belt use discussed  Sunscreen use discussed, no changing moles on skin. Sees derm yearly Non smoker Alcohol - none  Lives with [facility] at [facility], no pets Son and family live in [place]: retired, was [employer] Activity: [employer] exercise program 2x/wk, enjoys walking Diet: good water, fruits/vegetables daily  Relevant past medical, surgical, family and social history reviewed and updated as indicated. Interim medical history since our last visit reviewed. Allergies and medications reviewed and updated. Outpatient Medications Prior to Visit  Medication Sig Dispense Refill  . aspirin 81 MG tablet Take 81 mg by mouth every Monday, Wednesday, and Friday.     . finasteride  (PROSCAR) 5 MG tablet TAKE 1 TABLET BY MOUTH ONCE DAILY 30 tablet 6  . tamsulosin (FLOMAX) 0.4 MG CAPS capsule take 2 capsules by mouth once daily (Patient taking differently: take 1 capsule by mouth once daily) 60 capsule 6  . oxybutynin (DITROPAN) 5 MG tablet Take 0.5-1 tablets (2.5-5 mg total) by mouth at bedtime as needed (nocturia). 30 tablet 3   No facility-administered medications prior to visit.      Per HPI unless specifically indicated in ROS section below Review of Systems     Objective:    BP 110/70 (BP Location: Right Arm, Patient Position: Sitting, Cuff Size: Normal)   Pulse 79   Temp 98.5 F (36.9 C) (Oral)   Ht 5\' 10"  (1.778 m) Comment: no shoes  Wt 171 lb 12 oz (77.9 kg)   SpO2 95%   BMI 24.64 kg/m   Wt Readings from Last 3 Encounters:  09/09/17 171 lb 12 oz (77.9 kg)  09/09/17 171 lb 12 oz (77.9 kg)  03/03/17 173 lb 4 oz (78.6 kg)    Physical Exam  Constitutional: He is oriented to person, place, and time. He appears well-developed and well-nourished. No distress.  HENT:  Head: Normocephalic and atraumatic.  Right Ear: Hearing, tympanic membrane, external ear and ear canal normal.  Left Ear: Hearing, tympanic membrane, external ear and ear canal normal.  Nose: Nose normal.  Mouth/Throat: Uvula is midline, oropharynx is clear and moist and mucous membranes are normal. No oropharyngeal exudate, posterior oropharyngeal edema or posterior  oropharyngeal erythema.  Eyes: Pupils are equal, round, and reactive to light. Conjunctivae and EOM are normal. No scleral icterus.  Neck: Normal range of motion. Neck supple. Carotid bruit is not present. No thyromegaly (small R nodule present) present.  Cardiovascular: Normal rate, regular rhythm, normal heart sounds and intact distal pulses.  No murmur heard. Pulses:      Radial pulses are 2+ on the right side, and 2+ on the left side.  Pulmonary/Chest: Effort normal and breath sounds normal. No respiratory distress. He has  no wheezes. He has no rales.  Abdominal: Soft. Bowel sounds are normal. He exhibits no distension and no mass. There is no tenderness. There is no rebound and no guarding.  Genitourinary: Rectum normal. Rectal exam shows no external hemorrhoid, no internal hemorrhoid, no fissure, no mass, no tenderness and anal tone normal. Prostate is enlarged (40gm, slight enlargement R lobe without induration or nodules). Prostate is not tender.  Musculoskeletal: Normal range of motion. He exhibits no edema.  Lymphadenopathy:    He has no cervical adenopathy.  Neurological: He is alert and oriented to person, place, and time.  CN grossly intact, station and gait intact  Skin: Skin is warm and dry. No rash noted.  Psychiatric: He has a normal mood and affect. His behavior is normal. Judgment and thought content normal.  Nursing note and vitals reviewed.  Results for orders placed or performed in visit on 07/16/17  VAS US CAROTID  Result Value Ref Range   Right CCA prox sys 63 cm/s   Right CCA prox dias 9 cm/s   Right cca dist sys -78 cm/s   Left CCA prox sys 140 cm/s   Left CCA prox dias 27 cm/s   Left CCA dist sys -145 cm/s   Left CCA dist dias -28 cm/s   Left ICA prox sys -164 cm/s   Left ICA prox dias -37 cm/s   Left ICA dist sys -64 cm/s   Left ICA dist dias -17 cm/s   RIGHT ECA DIAS -14.00 cm/s   RIGHT VERTEBRAL DIAS 10.00 cm/s   LEFT ECA DIAS -20.00 cm/s   LEFT VERTEBRAL DIAS 9.00 cm/s   Lab Results  Component Value Date   PSA 3.53 08/25/2016   PSA 2.45 08/06/2014       Assessment & Plan:   Problem List Items Addressed This Visit    Advanced care planning/counseling discussion    HCPOA/Advanced directive scanned into chart 08/2016 - Wife Opal Sidles is HCPOA then son Camila Li or daughter Carmell [place]. Grants discretion to [facility] for end of life decisions.       BPH (benign prostatic hyperplasia) - Primary    Finasteride started 08/2016 - this has helped urinary symptoms but endorses recurrent  urgency since stopping oxybutynin - will trial myrbetriq 25mg  daily. Update with effect. Continue finasteride and flomax 0.4mg  daily. Nocturia down to once nightly.      Carotid stenosis    Not appreciated today and recent US reassuring - will recheck in 1 yr.      Heart murmur    Not appreciated today.       Right thyroid nodule    Will order thyroid US.       Relevant Orders   US THYROID   Urge incontinence    Trial myrbetriq.       Relevant Medications   mirabegron ER (MYRBETRIQ) 25 MG TB24 tablet       Meds ordered this encounter  Medications  . mirabegron ER (  MYRBETRIQ) 25 MG TB24 tablet    Sig: Take 1 tablet (25 mg total) by mouth daily.    Dispense:  30 tablet    Refill:  3   Orders Placed This Encounter  Procedures  . US THYROID    Standing Status:   Future    Standing Expiration Date:   11/10/2018    Order Specific Question:   Reason for Exam (SYMPTOM  OR DIAGNOSIS REQUIRED)    Answer:   R thyroid nodule seen on carotid US    Order Specific Question:   Preferred imaging location?    Answer:   [facility]    Follow up plan: Return in about 1 year (around 09/10/2018) for medicare wellness visit.  Ria Bush, MD

## 2017-09-09 NOTE — Progress Notes (Signed)
PCP notes:   Health maintenance:  No gaps identified.  Abnormal screenings:   None  Patient concerns:   Patient wants to discuss urinary concerns with PCP at next appt.  Nurse concerns:  None  Next PCP appt:   09/09/17 @ 0830

## 2017-09-09 NOTE — Patient Instructions (Signed)
Mr. Kuch , Thank you for taking time to come for your Medicare Wellness Visit. I appreciate your ongoing commitment to your health goals. Please review the following plan we discussed and let me know if I can assist you in the future.   These are the goals we discussed: Goals    . Increase physical activity     Starting 09/09/2017, I will continue to exercise for 60 minutes 2 days per week.        This is a list of the screening recommended for you and due dates:  Health Maintenance  Topic Date Due  . Flu Shot  01/06/2018  . DTaP/Tdap/Td vaccine (2 - Td) 08/05/2024  . Tetanus Vaccine  08/05/2024  . Pneumonia vaccines  Completed   Preventive Care for Adults  A healthy lifestyle and preventive care can promote health and wellness. Preventive health guidelines for adults include the following key practices.  . A routine yearly physical is a good way to check with your health care provider about your health and preventive screening. It is a chance to share any concerns and updates on your health and to receive a thorough exam.  . Visit your dentist for a routine exam and preventive care every 6 months. Brush your teeth twice a day and floss once a day. Good oral hygiene prevents tooth decay and gum disease.  . The frequency of eye exams is based on your age, health, family medical history, use  of contact lenses, and other factors. Follow your health care provider's recommendations for frequency of eye exams.  . Eat a healthy diet. Foods like vegetables, fruits, whole grains, low-fat dairy products, and lean protein foods contain the nutrients you need without too many calories. Decrease your intake of foods high in solid fats, added sugars, and salt. Eat the right amount of calories for you. Get information about a proper diet from your health care provider, if necessary.  . Regular physical exercise is one of the most important things you can do for your health. Most adults should get  at least 150 minutes of moderate-intensity exercise (any activity that increases your heart rate and causes you to sweat) each week. In addition, most adults need muscle-strengthening exercises on 2 or more days a week.  Silver Sneakers may be a benefit available to you. To determine eligibility, you may visit the website: www.silversneakers.com or contact program at 608-321-5445 Mon-Fri between 8AM-8PM.   . Maintain a healthy weight. The body mass index (BMI) is a screening tool to identify possible weight problems. It provides an estimate of body fat based on height and weight. Your health care provider can find your BMI and can help you achieve or maintain a healthy weight.   For adults 20 years and older: ? A BMI below 18.5 is considered underweight. ? A BMI of 18.5 to 24.9 is normal. ? A BMI of 25 to 29.9 is considered overweight. ? A BMI of 30 and above is considered obese.   . Maintain normal blood lipids and cholesterol levels by exercising and minimizing your intake of saturated fat. Eat a balanced diet with plenty of fruit and vegetables. Blood tests for lipids and cholesterol should begin at age 65 and be repeated every 5 years. If your lipid or cholesterol levels are high, you are over 50, or you are at high risk for heart disease, you may need your cholesterol levels checked more frequently. Ongoing high lipid and cholesterol levels should be treated with medicines  if diet and exercise are not working.  . If you smoke, find out from your health care provider how to quit. If you do not use tobacco, please do not start.  . If you choose to drink alcohol, please do not consume more than 2 drinks per day. One drink is considered to be 12 ounces (355 mL) of beer, 5 ounces (148 mL) of wine, or 1.5 ounces (44 mL) of liquor.  . If you are 37-50 years old, ask your health care provider if you should take aspirin to prevent strokes.  . Use sunscreen. Apply sunscreen liberally and repeatedly  throughout the day. You should seek shade when your shadow is shorter than you. Protect yourself by wearing long sleeves, pants, a wide-brimmed hat, and sunglasses year round, whenever you are outdoors.  . Once a month, do a whole body skin exam, using a mirror to look at the skin on your back. Tell your health care provider of new moles, moles that have irregular borders, moles that are larger than a pencil eraser, or moles that have changed in shape or color.

## 2017-09-09 NOTE — Assessment & Plan Note (Signed)
Not appreciated today.  

## 2017-09-09 NOTE — Assessment & Plan Note (Signed)
Will order thyroid US.

## 2017-09-09 NOTE — Assessment & Plan Note (Addendum)
Finasteride started 08/2016 - this has helped urinary symptoms but endorses recurrent urgency since stopping oxybutynin - will trial myrbetriq 25mg  daily. Update with effect. Continue finasteride and flomax 0.4mg  daily. Nocturia down to once nightly.

## 2017-09-09 NOTE — Assessment & Plan Note (Addendum)
Not appreciated today and recent US reassuring - will recheck in 1 yr.

## 2017-09-09 NOTE — Assessment & Plan Note (Signed)
HCPOA/Advanced directive scanned into chart 08/2016 - Wife Harry Ray is HCPOA then son Harry Ray or daughter Harry Ray. Grants discretion to [employer] for end of life decisions.

## 2017-09-12 NOTE — Progress Notes (Signed)
I reviewed health advisor's note, was available for consultation, and agree with documentation and plan.  

## 2017-09-16 ENCOUNTER — Ambulatory Visit
Admission: RE | Admit: 2017-09-16 | Discharge: 2017-09-16 | Disposition: A | Discharge status: Home / Self Care | Payer: Medicare Other | Source: Ambulatory Visit | Attending: Family Medicine | Admitting: Family Medicine

## 2017-09-16 DIAGNOSIS — E041 Nontoxic single thyroid nodule: Secondary | ICD-10-CM | POA: Insufficient documentation

## 2017-09-17 ENCOUNTER — Encounter: Payer: Self-pay | Admitting: Family Medicine

## 2017-09-23 MED ORDER — OXYBUTYNIN CHLORIDE 5 MG PO TABS: 5.0000 mg | ORAL_TABLET | Freq: Every evening | ORAL | 6 refills | Status: DC | PRN

## 2017-10-21 DIAGNOSIS — H353131 Nonexudative age-related macular degeneration, bilateral, early dry stage: Secondary | ICD-10-CM | POA: Diagnosis not present

## 2017-11-07 ENCOUNTER — Other Ambulatory Visit: Payer: Self-pay | Admitting: Family Medicine

## 2017-11-11 ENCOUNTER — Encounter: Payer: Self-pay | Admitting: Family Medicine

## 2017-11-16 NOTE — Addendum Note (Signed)
Addended by: Ria Bush on: 11/16/2017 09:04 PM   Modules accepted: Orders

## 2018-03-07 ENCOUNTER — Encounter: Payer: Self-pay | Admitting: Family Medicine

## 2018-03-08 DIAGNOSIS — H353132 Nonexudative age-related macular degeneration, bilateral, intermediate dry stage: Secondary | ICD-10-CM | POA: Diagnosis not present

## 2018-03-09 ENCOUNTER — Other Ambulatory Visit: Payer: Self-pay | Admitting: Family Medicine

## 2018-03-17 ENCOUNTER — Ambulatory Visit (INDEPENDENT_AMBULATORY_CARE_PROVIDER_SITE_OTHER): Payer: Medicare Other

## 2018-03-17 DIAGNOSIS — Z23 Encounter for immunization: Secondary | ICD-10-CM

## 2018-03-31 ENCOUNTER — Encounter: Payer: Self-pay | Admitting: Family Medicine

## 2018-04-25 DIAGNOSIS — R208 Other disturbances of skin sensation: Secondary | ICD-10-CM | POA: Diagnosis not present

## 2018-04-25 DIAGNOSIS — L570 Actinic keratosis: Secondary | ICD-10-CM | POA: Diagnosis not present

## 2018-04-25 DIAGNOSIS — D2261 Melanocytic nevi of right upper limb, including shoulder: Secondary | ICD-10-CM | POA: Diagnosis not present

## 2018-04-25 DIAGNOSIS — D225 Melanocytic nevi of trunk: Secondary | ICD-10-CM | POA: Diagnosis not present

## 2018-04-25 DIAGNOSIS — X32XXXA Exposure to sunlight, initial encounter: Secondary | ICD-10-CM | POA: Diagnosis not present

## 2018-04-25 DIAGNOSIS — D2272 Melanocytic nevi of left lower limb, including hip: Secondary | ICD-10-CM | POA: Diagnosis not present

## 2018-04-25 DIAGNOSIS — D2262 Melanocytic nevi of left upper limb, including shoulder: Secondary | ICD-10-CM | POA: Diagnosis not present

## 2018-04-25 DIAGNOSIS — L538 Other specified erythematous conditions: Secondary | ICD-10-CM | POA: Diagnosis not present

## 2018-04-25 DIAGNOSIS — L820 Inflamed seborrheic keratosis: Secondary | ICD-10-CM | POA: Diagnosis not present

## 2018-05-03 ENCOUNTER — Ambulatory Visit (INDEPENDENT_AMBULATORY_CARE_PROVIDER_SITE_OTHER): Payer: Medicare Other | Admitting: Internal Medicine

## 2018-05-03 ENCOUNTER — Encounter: Payer: Self-pay | Admitting: Internal Medicine

## 2018-05-03 VITALS — BP 112/78 | HR 84 | Temp 98.2°F | Wt 171.0 lb

## 2018-05-03 DIAGNOSIS — J0120 Acute ethmoidal sinusitis, unspecified: Secondary | ICD-10-CM | POA: Diagnosis not present

## 2018-05-03 DIAGNOSIS — I6529 Occlusion and stenosis of unspecified carotid artery: Secondary | ICD-10-CM | POA: Diagnosis not present

## 2018-05-03 MED ORDER — AMOXICILLIN 875 MG PO TABS: 875.0000 mg | ORAL_TABLET | Freq: Two times a day (BID) | ORAL | 0 refills | Status: DC

## 2018-05-03 NOTE — Progress Notes (Signed)
Subjective:    Patient ID: Harry Ray, male    DOB: [birth year], 84 y.o.   MRN: [id number]  HPI  Pt presents to the clinic today with c/o headache and nasal congestion. This started 3-4 days ago. The headache is located in his forehead. He describes the headache as pressure. He denies visual changes, dizziness, sensitivity to light or sound, nausea or vomiting. He is not able to blow anything out of his nose. He denies ear pain, runny nose, sore throat or cough. He denies fever, chills or body aches. He has tried Tylenol and [employer] with minimal relief.   Review of Systems      Past Medical History:  Diagnosis Date  . Arthritis    pinkys bilaterally and left thumb  . Carotid stenosis 09/14/1854   LICA 31-49%, RICA 7-02%, rpt 1 yr (09/2016)  . Heart murmur    minor  . History of asthma 1983   asthma attack as reaction to allergy medication (2d ICU stay)  . History of chicken pox   . Nephritis childhood  . Seasonal allergies    spring  . Urine incontinence     Current Outpatient Medications  Medication Sig Dispense Refill  . finasteride (PROSCAR) 5 MG tablet TAKE 1 TABLET BY MOUTH ONCE DAILY 90 tablet 1  . oxybutynin (DITROPAN) 5 MG tablet Take 1 tablet (5 mg total) by mouth at bedtime as needed (nocturia). 30 tablet 6  . tamsulosin (FLOMAX) 0.4 MG CAPS capsule Take 1 capsule (0.4 mg total) by mouth daily.     No current facility-administered medications for this visit.     No Known Allergies  Family History  Problem Relation Age of Onset  . Dementia Mother        Pick's disease  . Cancer Maternal Grandfather        stomach  . Cancer Paternal Uncle        throat - smoker  . Diabetes Neg Hx   . CAD Neg Hx     Social History   Socioeconomic History  . Marital status: Married    Spouse name: Not on file  . Number of children: Not on file  . Years of education: Not on file  . Highest education level: Not on file  Occupational History  . Not on file  Social  Needs  . Financial resource strain: Not on file  . Food insecurity:    Worry: Not on file    Inability: Not on file  . Transportation needs:    Medical: Not on file    Non-medical: Not on file  Tobacco Use  . Smoking status: Never Smoker  . Smokeless tobacco: Never Used  Substance and Sexual Activity  . Alcohol use: No    Alcohol/week: 0.0 standard drinks  . Drug use: No  . Sexual activity: Not on file  Lifestyle  . Physical activity:    Days per week: Not on file    Minutes per session: Not on file  . Stress: Not on file  Relationships  . Social connections:    Talks on phone: Not on file    Gets together: Not on file    Attends religious service: Not on file    Active member of club or organization: Not on file    Attends meetings of clubs or organizations: Not on file    Relationship status: Not on file  . Intimate partner violence:    Fear of current or ex partner:  Not on file    Emotionally abused: Not on file    Physically abused: Not on file    Forced sexual activity: Not on file  Other Topics Concern  . Not on file  Social History Narrative   Lives with wife at [facility], no pets   Son and family live in [place]: retired, was [employer]   Activity: starting [place] exercise program 2x/wk, enjoys walking     Constitutional: Pt reports headache. Denies fever, malaise, fatigue, or abrupt weight changes.  HEENT: Pt reports nasal congestion. Denies eye pain, eye redness, ear pain, ringing in the ears, wax buildup, runny nose, bloody nose, or sore throat. Respiratory: Denies difficulty breathing, shortness of breath, cough or sputum production.   Cardiovascular: Denies chest pain, chest tightness, palpitations or swelling in the hands or feet.  Neurological: Denies dizziness, difficulty with memory, difficulty with speech or problems with balance and coordination.    No other specific complaints in a complete review of systems (except  as listed in HPI above).  Objective:   Physical Exam   BP 112/78   Pulse 84   Temp 98.2 F (36.8 C) (Oral)   Wt 171 lb (77.6 kg)   SpO2 97%   BMI 24.54 kg/m  Wt Readings from Last 3 Encounters:  05/03/18 171 lb (77.6 kg)  09/09/17 171 lb 12 oz (77.9 kg)  09/09/17 171 lb 12 oz (77.9 kg)    General: Appears his stated age, well developed, well nourished in NAD. HEENT: Head: normal shape and size, ethmoidal sinus tenderness noted; Nose: mucosa pink and moist, septum midline, thick green mucous noted in bilateral nares; Throat/Mouth: Teeth present, mucosa pink and moist,= PND,  no exudate, lesions or ulcerations noted.  Neck:  No adenopathy noted. Pulmonary/Chest: Normal effort and positive vesicular breath sounds. No respiratory distress. No wheezes, rales or ronchi noted.   BMET    Component Value Date/Time   NA 140 09/09/2017 0830   NA 141 08/06/2014   K 4.3 09/09/2017 0830   K 4.2 08/06/2014   CL 104 09/09/2017 0830   CO2 29 09/09/2017 0830   GLUCOSE 97 09/09/2017 0830   BUN 20 09/09/2017 0830   BUN 23 (A) 08/06/2014   CREATININE 0.92 09/09/2017 0830   CREATININE 0.8 08/06/2014   CALCIUM 9.1 09/09/2017 0830    Lipid Panel     Component Value Date/Time   CHOL 174 08/06/2014   TRIG 82 08/06/2014   HDL 47 08/06/2014   LDLCALC 111 08/06/2014    CBC    Component Value Date/Time   WBC 6.9 08/19/2016 1049   RBC 4.41 08/19/2016 1049   HGB 13.9 08/19/2016 1049   HCT 41.1 08/19/2016 1049   PLT 256.0 08/19/2016 1049   MCV 93.2 08/19/2016 1049   MCHC 33.8 08/19/2016 1049   RDW 13.2 08/19/2016 1049   LYMPHSABS 1.5 08/19/2016 1049   MONOABS 0.6 08/19/2016 1049   EOSABS 0.4 08/19/2016 1049   BASOSABS 0.0 08/19/2016 1049    Hgb A1C No results found for: HGBA1C         Assessment & Plan:   Acute Ethmoidal Sinusitis:  Can use a Neti Pot to be purchased prn RX for Amoxil 875 mg BID x 10 days Start Claritin and Flonase OTC  Return precautions  discussed Webb Silversmith, NP

## 2018-05-04 ENCOUNTER — Telehealth: Payer: Self-pay

## 2018-05-04 NOTE — Telephone Encounter (Signed)
Pt was seen by [facility] for an acute illness.... Pt wanted me to send a message to you in reference to his ongoing urinary problems.... Pt reports in the past week while on vacation he had 2 episodes of "full incontinence", accidents, unable to control.... Pt wanted to know if there should be some medication changes and he has only been taking 1/2 tab of Ditropan.... Please advise

## 2018-05-04 NOTE — Telephone Encounter (Signed)
Spoke to pt. He has not looked in to the price of Mybetriq yet. He will look at that and increase the Ditropan. He will let us know if he wants a referral.

## 2018-05-04 NOTE — Patient Instructions (Signed)

## 2018-05-04 NOTE — Telephone Encounter (Signed)
Did he price out myrbetriq? Could try this in place of ditropan. Or he could try to increase ditropan to full tablet twice daily as needed.  Would also offer urology referral to discuss other treatment options.

## 2018-06-19 ENCOUNTER — Encounter: Payer: Self-pay | Admitting: Family Medicine

## 2018-06-22 ENCOUNTER — Encounter: Payer: Self-pay | Admitting: Family Medicine

## 2018-06-22 ENCOUNTER — Ambulatory Visit (INDEPENDENT_AMBULATORY_CARE_PROVIDER_SITE_OTHER): Payer: Medicare Other | Admitting: Family Medicine

## 2018-06-22 VITALS — BP 118/70 | HR 78 | Temp 97.6°F | Ht 70.0 in | Wt 171.8 lb

## 2018-06-22 DIAGNOSIS — N401 Enlarged prostate with lower urinary tract symptoms: Secondary | ICD-10-CM

## 2018-06-22 DIAGNOSIS — R42 Dizziness and giddiness: Secondary | ICD-10-CM

## 2018-06-22 DIAGNOSIS — R3914 Feeling of incomplete bladder emptying: Secondary | ICD-10-CM

## 2018-06-22 NOTE — Progress Notes (Signed)
BP 118/70 (BP Location: Left Arm, Patient Position: Sitting, Cuff Size: Normal)   Pulse 78   Temp 97.6 F (36.4 C) (Oral)   Ht 5\' 10"  (1.778 m)   Wt 171 lb 12 oz (77.9 kg)   SpO2 100%   BMI 24.64 kg/m   No data found.  CC: dizziness Subjective:    Patient ID: Harry Ray, male    DOB: Nov 22, 1931, 85 y.o.   MRN: [id number]  HPI: Harry Ray is a 85 y.o. male presenting on 06/22/2018 for Dizziness (C/o occasional dizzy episodes. Started 7-10 days ago. Has had 3-4 episodes. Pt accompanied by his wife, Opal Sidles. )   Isolated episodes of dizziness noted over the last 2-3 weeks. Describes faint head feeling and possible presyncope, not vertigo, imbalance. Not necessarily orthostatic. Episodes last 1-2 minutes then clear up. Has had 2 episodes since. Symptoms improve when he sits down. No LOC, seizures. Possibly blurry vision during epsiodes.   No fevers/chills, cough, chest pain/tightness, dyspnea, headache, palpitations. No paresthesias or numbness. No hemiparesis.   Drinks significant amt coffee in the morning.   Myrbetriq was expensive.  Wife sees Dr Eulogio Ditch.      Relevant past medical, surgical, family and social history reviewed and updated as indicated. Interim medical history since our last visit reviewed. Allergies and medications reviewed and updated. Outpatient Medications Prior to Visit  Medication Sig Dispense Refill  . finasteride (PROSCAR) 5 MG tablet TAKE 1 TABLET BY MOUTH ONCE DAILY 90 tablet 1  . oxybutynin (DITROPAN) 5 MG tablet Take 1 tablet (5 mg total) by mouth at bedtime as needed (nocturia). (Patient taking differently: Take 2.5 mg by mouth at bedtime as needed (nocturia). ) 30 tablet 6  . tamsulosin (FLOMAX) 0.4 MG CAPS capsule Take 1 capsule (0.4 mg total) by mouth daily.    . fluticasone (FLONASE) 50 MCG/ACT nasal spray Place 2 sprays into both nostrils daily as needed for allergies or rhinitis.    Marland Kitchen amoxicillin (AMOXIL) 875 MG tablet Take 1 tablet (875  mg total) by mouth 2 (two) times daily. 20 tablet 0   No facility-administered medications prior to visit.      Per HPI unless specifically indicated in ROS section below Review of Systems Objective:    BP 118/70 (BP Location: Left Arm, Patient Position: Sitting, Cuff Size: Normal)   Pulse 78   Temp 97.6 F (36.4 C) (Oral)   Ht 5\' 10"  (1.778 m)   Wt 171 lb 12 oz (77.9 kg)   SpO2 100%   BMI 24.64 kg/m   Wt Readings from Last 3 Encounters:  06/22/18 171 lb 12 oz (77.9 kg)  05/03/18 171 lb (77.6 kg)  09/09/17 171 lb 12 oz (77.9 kg)    Physical Exam Vitals signs and nursing note reviewed.  Constitutional:      Appearance: Normal appearance. He is not ill-appearing.  HENT:     Head: Normocephalic and atraumatic.     Mouth/Throat:     Mouth: Mucous membranes are moist.     Pharynx: Oropharynx is clear.  Eyes:     Extraocular Movements: Extraocular movements intact.     Conjunctiva/sclera: Conjunctivae normal.     Pupils: Pupils are equal, round, and reactive to light.  Neck:     Vascular: No carotid bruit.  Cardiovascular:     Rate and Rhythm: Normal rate and regular rhythm.     Pulses: Normal pulses.     Heart sounds: Normal heart sounds. No murmur.  Pulmonary:  Effort: Pulmonary effort is normal. No respiratory distress.     Breath sounds: Normal breath sounds. No wheezing, rhonchi or rales.  Skin:    General: Skin is warm and dry.     Capillary Refill: Capillary refill takes less than 2 seconds.     Findings: No rash.  Neurological:     General: No focal deficit present.     Mental Status: He is alert.     Cranial Nerves: Cranial nerves are intact.     Sensory: Sensation is intact.     Motor: Motor function is intact.     Coordination: Coordination is intact. Romberg sign negative. Coordination normal. Finger-Nose-Finger Test normal.     Gait: Gait is intact.     Comments: CN 2-12 intact FTN intact No pronator drift EOMI  Psychiatric:        Mood and  Affect: Mood normal.        Behavior: Behavior normal.       Results for orders placed or performed in visit on 09/09/17  TSH  Result Value Ref Range   TSH 1.23 0.35 - 4.50 uIU/mL  PSA  Result Value Ref Range   PSA 0.72 0.10 - 4.00 ng/mL  Basic metabolic panel  Result Value Ref Range   Sodium 140 135 - 145 mEq/L   Potassium 4.3 3.5 - 5.1 mEq/L   Chloride 104 96 - 112 mEq/L   CO2 29 19 - 32 mEq/L   Glucose, Bld 97 70 - 99 mg/dL   BUN 20 6 - 23 mg/dL   Creatinine, Ser 0.92 0.40 - 1.50 mg/dL   Calcium 9.1 8.4 - 10.5 mg/dL   GFR 83.30 >60.00 mL/min   EKG - NSR rate 60s, normal axis, intervals, no acute ST/T changes, good R wave progression, frequent PACs Assessment & Plan:   Problem List Items Addressed This Visit    Dizziness - Primary    Describes lightheadedness/presyncope. ?orthostatic. He does drink significant caffeine in place of water and is on medications that could be contributory. Recommended increased water intake, and if not improving with this we will need to address oxybutynin and flomax - consider transition to myrbetriq. He will keep me updated on symptoms. Check EKG today.  Will return next month for labwork with physical.       Relevant Orders   EKG 12-Lead (Completed)   BPH (benign prostatic hyperplasia)    See above. Continue current medicines for now (finasteride, oxybutynin 5mg  nightly, flomax 0.4mg  nightly). May need change as per below.           No orders of the defined types were placed in this encounter.  Orders Placed This Encounter  Procedures  . EKG 12-Lead   Patient Instructions  EKG today.  Work on hydration - less coffee, more water.  If worsening dizziness, let me know. We may need to change prostate medicines.    Follow up plan: No follow-ups on file.  Ria Bush, MD

## 2018-06-22 NOTE — Patient Instructions (Addendum)
EKG today.  Work on hydration - less coffee, more water.  If worsening dizziness, let me know. We may need to change prostate medicines.

## 2018-06-24 DIAGNOSIS — R42 Dizziness and giddiness: Secondary | ICD-10-CM | POA: Insufficient documentation

## 2018-06-24 NOTE — Assessment & Plan Note (Addendum)
Describes lightheadedness/presyncope. ?orthostatic. He does drink significant caffeine in place of water and is on medications that could be contributory. Recommended increased water intake, and if not improving with this we will need to address oxybutynin and flomax - consider transition to myrbetriq. He will keep me updated on symptoms. Check EKG today.  Will return next month for labwork with physical.

## 2018-06-24 NOTE — Assessment & Plan Note (Signed)
See above. Continue current medicines for now (finasteride, oxybutynin 5mg  nightly, flomax 0.4mg  nightly). May need change as per below.

## 2018-07-07 ENCOUNTER — Telehealth: Payer: Medicare Other | Admitting: Family

## 2018-07-07 DIAGNOSIS — B9689 Other specified bacterial agents as the cause of diseases classified elsewhere: Secondary | ICD-10-CM | POA: Diagnosis not present

## 2018-07-07 DIAGNOSIS — J208 Acute bronchitis due to other specified organisms: Secondary | ICD-10-CM | POA: Diagnosis not present

## 2018-07-07 MED ORDER — ALBUTEROL SULFATE HFA 108 (90 BASE) MCG/ACT IN AERS: 2.0000 | INHALATION_SPRAY | Freq: Four times a day (QID) | RESPIRATORY_TRACT | 0 refills | Status: DC | PRN

## 2018-07-07 MED ORDER — BENZONATATE 100 MG PO CAPS: 100.0000 mg | ORAL_CAPSULE | Freq: Three times a day (TID) | ORAL | 0 refills | Status: DC | PRN

## 2018-07-07 MED ORDER — DOXYCYCLINE HYCLATE 100 MG PO TABS: 100.0000 mg | ORAL_TABLET | Freq: Two times a day (BID) | ORAL | 0 refills | Status: DC

## 2018-07-07 NOTE — Progress Notes (Signed)
Greater than 5 minutes, yet less than 10 minutes of time have been spent researching, coordinating, and implementing care for this patient today.  Thank you for the details you included in the comment boxes. Those details are very helpful in determining the best course of treatment for you and help us to provide the best care.  We are sorry that you are not feeling well.  Here is how we plan to help!  Based on your presentation I believe you most likely have A cough due to bacteria.  When patients have a fever and a productive cough with a change in color or increased sputum production, we are concerned about bacterial bronchitis.  If left untreated it can progress to pneumonia.  If your symptoms do not improve with your treatment plan it is important that you contact your provider.   I have prescribed Doxycycline 100 mg twice a day for 7 days     In addition you may use A non-prescription cough medication called Mucinex DM: take 2 tablets every 12 hours. and A prescription cough medication called Tessalon Perles 100mg. You may take 1-2 capsules every 8 hours as needed for your cough.  I have also added an Albuterol inhaler, take 2 puffs every 6 hours as needed for shortness of breath.   From your responses in the eVisit questionnaire you describe inflammation in the upper respiratory tract which is causing a significant cough.  This is commonly called Bronchitis and has four common causes:    Allergies  Viral Infections  Acid Reflux  Bacterial Infection Allergies, viruses and acid reflux are treated by controlling symptoms or eliminating the cause. An example might be a cough caused by taking certain blood pressure medications. You stop the cough by changing the medication. Another example might be a cough caused by acid reflux. Controlling the reflux helps control the cough.  USE OF BRONCHODILATOR ("RESCUE") INHALERS: There is a risk from using your bronchodilator too frequently.  The risk  is that over-reliance on a medication which only relaxes the muscles surrounding the breathing tubes can reduce the effectiveness of medications prescribed to reduce swelling and congestion of the tubes themselves.  Although you feel brief relief from the bronchodilator inhaler, your asthma may actually be worsening with the tubes becoming more swollen and filled with mucus.  This can delay other crucial treatments, such as oral steroid medications. If you need to use a bronchodilator inhaler daily, several times per day, you should discuss this with your provider.  There are probably better treatments that could be used to keep your asthma under control.     HOME CARE . Only take medications as instructed by your medical team. . Complete the entire course of an antibiotic. . Drink plenty of fluids and get plenty of rest. . Avoid close contacts especially the very [place] and the elderly . Cover your mouth if you cough or cough into your sleeve. . Always remember to wash your hands . A steam or ultrasonic humidifier can help congestion.   GET HELP RIGHT AWAY IF: . You develop worsening fever. . You become short of breath . You cough up blood. . Your symptoms persist after you have completed your treatment plan MAKE SURE YOU   Understand these instructions.  Will watch your condition.  Will get help right away if you are not doing well or get worse.  Your e-visit answers were reviewed by a board certified advanced clinical practitioner to complete your personal care plan.    Depending on the condition, your plan could have included both over the counter or prescription medications. If there is a problem please reply  once you have received a response from your provider. Your safety is important to us.  If you have drug allergies check your prescription carefully.    You can use MyChart to ask questions about today's visit, request a non-urgent call back, or ask for a work or school excuse for  24 hours related to this e-Visit. If it has been greater than 24 hours you will need to follow up with your provider, or enter a new e-Visit to address those concerns. You will get an e-mail in the next two days asking about your experience.  I hope that your e-visit has been valuable and will speed your recovery. Thank you for using e-visits.   

## 2018-08-03 ENCOUNTER — Other Ambulatory Visit: Payer: Self-pay | Admitting: Family Medicine

## 2018-08-28 ENCOUNTER — Other Ambulatory Visit: Payer: Self-pay | Admitting: Family Medicine

## 2018-09-16 ENCOUNTER — Encounter: Payer: Self-pay | Admitting: Family Medicine

## 2018-09-16 DIAGNOSIS — J208 Acute bronchitis due to other specified organisms: Principal | ICD-10-CM

## 2018-09-16 DIAGNOSIS — B9689 Other specified bacterial agents as the cause of diseases classified elsewhere: Secondary | ICD-10-CM

## 2018-09-19 MED ORDER — BENZONATATE 100 MG PO CAPS: 100.0000 mg | ORAL_CAPSULE | Freq: Three times a day (TID) | ORAL | 0 refills | Status: DC | PRN

## 2018-09-23 ENCOUNTER — Other Ambulatory Visit: Payer: Self-pay

## 2018-09-23 ENCOUNTER — Ambulatory Visit (INDEPENDENT_AMBULATORY_CARE_PROVIDER_SITE_OTHER): Payer: Medicare Other

## 2018-09-23 ENCOUNTER — Other Ambulatory Visit: Payer: Self-pay | Admitting: Family Medicine

## 2018-09-23 ENCOUNTER — Other Ambulatory Visit (INDEPENDENT_AMBULATORY_CARE_PROVIDER_SITE_OTHER): Payer: Medicare Other

## 2018-09-23 ENCOUNTER — Encounter: Payer: Medicare Other | Admitting: Family Medicine

## 2018-09-23 DIAGNOSIS — E041 Nontoxic single thyroid nodule: Secondary | ICD-10-CM | POA: Diagnosis not present

## 2018-09-23 DIAGNOSIS — N401 Enlarged prostate with lower urinary tract symptoms: Secondary | ICD-10-CM

## 2018-09-23 DIAGNOSIS — Z13220 Encounter for screening for lipoid disorders: Secondary | ICD-10-CM

## 2018-09-23 DIAGNOSIS — R3914 Feeling of incomplete bladder emptying: Secondary | ICD-10-CM | POA: Diagnosis not present

## 2018-09-23 DIAGNOSIS — Z0000 Encounter for general adult medical examination without abnormal findings: Secondary | ICD-10-CM | POA: Diagnosis not present

## 2018-09-23 LAB — LIPID PANEL
Cholesterol: 156 mg/dL (ref 0–200)
HDL: 37.3 mg/dL — ABNORMAL LOW (ref >39.00)
LDL Cholesterol: 96 mg/dL (ref 0–99)
NonHDL: 118.69
Total CHOL/HDL Ratio: 4
Triglycerides: 112 mg/dL (ref 0.0–149.0)
VLDL: 22.4 mg/dL (ref 0.0–40.0)

## 2018-09-23 LAB — BASIC METABOLIC PANEL
BUN: 16 mg/dL (ref 6–23)
CO2: 26 mEq/L (ref 19–32)
Calcium: 9 mg/dL (ref 8.4–10.5)
Chloride: 104 mEq/L (ref 96–112)
Creatinine, Ser: 0.95 mg/dL (ref 0.40–1.50)
GFR: 75.34 mL/min (ref >60.00)
Glucose, Bld: 85 mg/dL (ref 70–99)
Potassium: 4.3 mEq/L (ref 3.5–5.1)
Sodium: 139 mEq/L (ref 135–145)

## 2018-09-23 LAB — TSH: TSH: 1.17 u[IU]/mL (ref 0.35–4.50)

## 2018-09-23 LAB — PSA: PSA: 0.45 ng/mL (ref 0.10–4.00)

## 2018-09-23 NOTE — Progress Notes (Signed)
PCP notes:   Health maintenance:  No gaps identified.  Abnormal screenings:   None  Patient concerns:   None  Nurse concerns:  None  Next PCP appt:   09/27/18 @ 0930

## 2018-09-23 NOTE — Progress Notes (Signed)
Subjective:   Harry Ray is a 85 y.o. male who presents for Medicare Annual/Subsequent preventive examination.  Review of Systems:  N/A Cardiac Risk Factors include: advanced age (>46men, >2 women);male gender     Objective:    Vitals: There were no vitals taken for this visit.  There is no height or weight on file to calculate BMI.  Advanced Directives 09/23/2018 09/09/2017  Does Patient Have a Medical Advance Directive? Yes Yes  Type of Paramedic of [place];Living will [place];Living will  Copy of [place] in Chart? Yes - validated most recent copy scanned in chart (See row information) Yes  Would patient like information on creating a medical advance directive? No - Patient declined -    Tobacco Social History   Tobacco Use  Smoking Status Never Smoker  Smokeless Tobacco Never Used     Counseling given: No   Clinical Intake:  Pre-visit preparation completed: Yes  Pain : No/denies pain Pain Score: 0-No pain     Nutritional Risks: None Diabetes: No  How often do you need to have someone help you when you read instructions, pamphlets, or other written materials from your doctor or pharmacy?: 1 - Never What is the last grade level you completed in school?: Master degree  Interpreter Needed?: No  Comments: pt lives with spouse Information entered by :: LPinson, LPN  Past Medical History:  Diagnosis Date  . Arthritis    pinkys bilaterally and left thumb  . Carotid stenosis 09/06/9620   LICA 29-79%, RICA 8-92%, rpt 1 yr (09/2016)  . Heart murmur    minor  . History of asthma 1983   asthma attack as reaction to allergy medication (2d ICU stay)  . History of chicken pox   . Nephritis childhood  . Seasonal allergies    spring  . Urine incontinence    Past Surgical History:  Procedure Laterality Date  . COLONOSCOPY  08/07/2005   Diverticulosis (Colon in [place])  . SCROTAL SURGERY      ? benign cyst removal   Family History  Problem Relation Age of Onset  . Dementia Mother        Pick's disease  . Cancer Maternal Grandfather        stomach  . Cancer Paternal Uncle        throat - smoker  . Diabetes Neg Hx   . CAD Neg Hx    Social History   Socioeconomic History  . Marital status: Married    Spouse name: Not on file  . Number of children: Not on file  . Years of education: Not on file  . Highest education level: Not on file  Occupational History  . Not on file  Social Needs  . Financial resource strain: Not on file  . Food insecurity:    Worry: Not on file    Inability: Not on file  . Transportation needs:    Medical: Not on file    Non-medical: Not on file  Tobacco Use  . Smoking status: Never Smoker  . Smokeless tobacco: Never Used  Substance and Sexual Activity  . Alcohol use: No    Alcohol/week: 0.0 standard drinks  . Drug use: No  . Sexual activity: Not on file  Lifestyle  . Physical activity:    Days per week: Not on file    Minutes per session: Not on file  . Stress: Not on file  Relationships  . Social  connections:    Talks on phone: Not on file    Gets together: Not on file    Attends religious service: Not on file    Active member of club or organization: Not on file    Attends meetings of clubs or organizations: Not on file    Relationship status: Not on file  Other Topics Concern  . Not on file  Social History Narrative   L[facility] with [facility], no pets   Son and family live in [place]: retired, was [employer]   Activity: starting [employer] [place] program 2x/wk, enjoys walking    Outpatient Encounter Medications as of 09/23/2018  Medication Sig  . benzonatate (TESSALON PERLES) 100 MG capsule Take 1-2 capsules (100-200 mg total) by mouth every 8 (eight) hours as needed for cough.  . doxycycline (VIBRA-TABS) 100 MG tablet Take 1 tablet (100 mg total) by mouth 2 (two) times daily.  .  finasteride (PROSCAR) 5 MG tablet TAKE 1 TABLET BY MOUTH EVERY DAY  . oxybutynin (DITROPAN) 5 MG tablet Take 1 tablet (5 mg total) by mouth at bedtime as needed (nocturia). (Patient taking differently: Take 2.5 mg by mouth at bedtime as needed (nocturia). )  . tamsulosin (FLOMAX) 0.4 MG CAPS capsule TAKE 2 CAPSULES BY MOUTH EVERY DAY  . fluticasone (FLONASE) 50 MCG/ACT nasal spray Place 2 sprays into both nostrils daily as needed for allergies or rhinitis.  . [DISCONTINUED] albuterol (PROVENTIL HFA;VENTOLIN HFA) 108 (90 Base) MCG/ACT inhaler Inhale 2 puffs into the lungs every 6 (six) hours as needed for wheezing or shortness of breath.   No facility-administered encounter medications on file as of 09/23/2018.     Activities of Daily Living In your present state of health, do you have any difficulty performing the following activities: 09/23/2018  Hearing? Y  Vision? N  Difficulty concentrating or making decisions? N  Walking or climbing stairs? N  Dressing or bathing? N  Doing errands, shopping? N  Preparing Food and eating ? N  Using the Toilet? N  In the past six months, have you accidently leaked urine? N  Do you have problems with loss of bowel control? N  Managing your Medications? N  Managing your Finances? N  Housekeeping or managing your Housekeeping? N  Some recent data might be hidden    Patient Care Team: Ria Bush, MD as PCP - General (Family Medicine)   Assessment:   This is a routine wellness examination for [name].  Vision Screening Comments: Vision exam approx. 6 mths ago with Dr.Brasington  Exercise Activities and Dietary recommendations Current Exercise Habits: Home exercise routine, Type of exercise: walking, Time (Minutes): 25, Frequency (Times/Week): 7, Weekly Exercise (Minutes/Week): 175, Intensity: Mild, Exercise limited by: None identified  Goals    . Patient Stated     Starting 09/23/18, I will continue take medications as prescribed.         Fall Risk Fall Risk  09/23/2018 09/09/2017 08/25/2016  Falls in the past year? 0 No No   Depression Screen PHQ 2/9 Scores 09/23/2018 09/09/2017 08/25/2016  PHQ - 2 Score 0 0 0  PHQ- 9 Score 0 0 -    Cognitive Function MMSE - Mini Mental State Exam 09/23/2018 09/09/2017  Orientation to time 5 5  Orientation to Place 5 5  Registration 3 3  Attention/ Calculation 0 0  Recall 3 3  Language- name 2 objects 0 0  Language- repeat 1 1  Language- follow 3  step command 0 3  Language- read & follow direction 0 0  Write a sentence 0 0  Copy design 0 0  Total score 17 20     PLEASE NOTE: A Mini-Cog screen was completed. Maximum score is 17. A value of 0 denotes this part of [place] MMSE was not completed or the patient failed this part of the Mini-Cog screening.   Mini-Cog Screening Orientation to Time - Max 5 pts Orientation to Place - Max 5 pts Registration - Max 3 pts Recall - Max 3 pts Language Repeat - Max 1 pts     Immunization History  Administered Date(s) Administered  . H1N1 05/19/2008  . Influenza Inj Mdck Quad Pf 04/29/2015  . Influenza Split 03/06/2008, 03/27/2011, 04/28/2012, 04/05/2013, 03/21/2014  . Influenza,inj,Quad PF,6+ Mos 02/18/2016, 03/03/2017, 03/17/2018  . Pneumococcal Conjugate-13 07/17/2014  . Pneumococcal Polysaccharide-23 04/09/2010  . Tdap 08/06/2014  . Zoster 05/15/2010    Screening Tests Health Maintenance  Topic Date Due  . INFLUENZA VACCINE  01/07/2019  . DTaP/Tdap/Td (2 - Td) 08/05/2024  . TETANUS/TDAP  08/05/2024  . PNA vac Low Risk Adult  Completed       Plan:    I have personally reviewed, addressed, and noted the following in the patient's chart:  A. Medical and social history B. Use of alcohol, tobacco or illicit drugs  C. Current medications and supplements D. Functional ability and status E.  Nutritional status F.  Physical activity G. Advance directives H. List of other physicians I.  Hospitalizations, surgeries, and ER visits in  previous 12 months J.  Vitals (unless it is a telemedicine encounter) K. Screenings to include hearing, vision, cognitive, depression L. Referrals and appointments   In addition, I have reviewed and discussed with patient certain preventive protocols, quality metrics, and best practice recommendations. A written personalized care plan for preventive services and recommendations were provided to patient.  With patient's permission, we connected on 09/23/18 at  8:00 AM EDT by a video enabled telemedicine application. Two patient identifiers were used to ensure the encounter occurred with the correct person. .   Patient was in home and writer was in office.   Signed,   Lindell Noe, MHA, BS, LPN Health Coach

## 2018-09-23 NOTE — Patient Instructions (Signed)
Harry Ray , Thank you for taking time to come for your Medicare Wellness Visit. I appreciate your ongoing commitment to your health goals. Please review the following plan we discussed and let me know if I can assist you in the future.   These are the goals we discussed: Goals    . Patient Stated     Starting 09/23/18, I will continue take medications as prescribed.        This is a list of the screening recommended for you and due dates:  Health Maintenance  Topic Date Due  . Flu Shot  01/07/2019  . DTaP/Tdap/Td vaccine (2 - Td) 08/05/2024  . Tetanus Vaccine  08/05/2024  . Pneumonia vaccines  Completed   Preventive Care for Adults  A healthy lifestyle and preventive care can promote health and wellness. Preventive health guidelines for adults include the following key practices.  . A routine yearly physical is a good way to check with your health care provider about your health and preventive screening. It is a chance to share any concerns and updates on your health and to receive a thorough exam.  . Visit your dentist for a routine exam and preventive care every 6 months. Brush your teeth twice a day and floss once a day. Good oral hygiene prevents tooth decay and gum disease.  . The frequency of eye exams is based on your age, health, family medical history, use  of contact lenses, and other factors. Follow your health care provider's recommendations for frequency of eye exams.  . Eat a healthy diet. Foods like vegetables, fruits, whole grains, low-fat dairy products, and lean protein foods contain the nutrients you need without too many calories. Decrease your intake of foods high in solid fats, added sugars, and salt. Eat the right amount of calories for you. Get information about a proper diet from your health care provider, if necessary.  . Regular physical exercise is one of the most important things you can do for your health. Most adults should get at least 150 minutes of  moderate-intensity exercise (any activity that increases your heart rate and causes you to sweat) each week. In addition, most adults need muscle-strengthening exercises on 2 or more days a week.  Silver Sneakers may be a benefit available to you. To determine eligibility, you may visit the website: www.silversneakers.com or contact program at (367)380-8138 Mon-Fri between 8AM-8PM.   . Maintain a healthy weight. The body mass index (BMI) is a screening tool to identify possible weight problems. It provides an estimate of body fat based on height and weight. Your health care provider can find your BMI and can help you achieve or maintain a healthy weight.   For adults 20 years and older: ? A BMI below 18.5 is considered underweight. ? A BMI of 18.5 to 24.9 is normal. ? A BMI of 25 to 29.9 is considered overweight. ? A BMI of 30 and above is considered obese.   . Maintain normal blood lipids and cholesterol levels by exercising and minimizing your intake of saturated fat. Eat a balanced diet with plenty of fruit and vegetables. Blood tests for lipids and cholesterol should begin at age 10 and be repeated every 5 years. If your lipid or cholesterol levels are high, you are over 50, or you are at high risk for heart disease, you may need your cholesterol levels checked more frequently. Ongoing high lipid and cholesterol levels should be treated with medicines if diet and exercise are not  working.  . If you smoke, find out from your health care provider how to quit. If you do not use tobacco, please do not start.  . If you choose to drink alcohol, please do not consume more than 2 drinks per day. One drink is considered to be 12 ounces (355 mL) of beer, 5 ounces (148 mL) of wine, or 1.5 ounces (44 mL) of liquor.  . If you are 100-48 years old, ask your health care provider if you should take aspirin to prevent strokes.  . Use sunscreen. Apply sunscreen liberally and repeatedly throughout the day. You  should seek shade when your shadow is shorter than you. Protect yourself by wearing long sleeves, pants, a wide-brimmed hat, and sunglasses year round, whenever you are outdoors.  . Once a month, do a whole body skin exam, using a mirror to look at the skin on your back. Tell your health care provider of new moles, moles that have irregular borders, moles that are larger than a pencil eraser, or moles that have changed in shape or color.

## 2018-09-27 ENCOUNTER — Encounter: Payer: Self-pay | Admitting: Family Medicine

## 2018-09-27 ENCOUNTER — Ambulatory Visit (INDEPENDENT_AMBULATORY_CARE_PROVIDER_SITE_OTHER): Payer: Medicare Other | Admitting: Family Medicine

## 2018-09-27 VITALS — BP 140/74 | HR 81 | Temp 97.4°F | Ht 70.0 in | Wt 169.0 lb

## 2018-09-27 DIAGNOSIS — E041 Nontoxic single thyroid nodule: Secondary | ICD-10-CM

## 2018-09-27 DIAGNOSIS — R42 Dizziness and giddiness: Secondary | ICD-10-CM | POA: Diagnosis not present

## 2018-09-27 DIAGNOSIS — I6529 Occlusion and stenosis of unspecified carotid artery: Secondary | ICD-10-CM | POA: Diagnosis not present

## 2018-09-27 DIAGNOSIS — R059 Cough, unspecified: Secondary | ICD-10-CM

## 2018-09-27 DIAGNOSIS — N401 Enlarged prostate with lower urinary tract symptoms: Secondary | ICD-10-CM | POA: Diagnosis not present

## 2018-09-27 DIAGNOSIS — R05 Cough: Secondary | ICD-10-CM | POA: Insufficient documentation

## 2018-09-27 DIAGNOSIS — E785 Hyperlipidemia, unspecified: Secondary | ICD-10-CM

## 2018-09-27 DIAGNOSIS — R3914 Feeling of incomplete bladder emptying: Secondary | ICD-10-CM

## 2018-09-27 MED ORDER — OXYBUTYNIN CHLORIDE 5 MG PO TABS: 2.5000 mg | ORAL_TABLET | Freq: Every evening | ORAL | 6 refills | Status: DC | PRN

## 2018-09-27 MED ORDER — LORATADINE 10 MG PO TABS: 10.0000 mg | ORAL_TABLET | Freq: Every day | ORAL | Status: DC | PRN

## 2018-09-27 NOTE — Assessment & Plan Note (Signed)
Update thyroid US. 

## 2018-09-27 NOTE — Assessment & Plan Note (Signed)
Ongoing since respiratory illness late jan 2020. Advised come in for CXR if persists.

## 2018-09-27 NOTE — Assessment & Plan Note (Signed)
Stable period.  

## 2018-09-27 NOTE — Progress Notes (Signed)
Virtual visit completed through [employer]. Due to national recommendations of social distancing due to [place] 19, a virtual visit is felt to be most appropriate for this patient at this time.   Patient location: home Provider location: [place] at [facility], office If any vitals were documented, they were collected by patient at home unless specified below.    BP 140/74 (BP Location: Left Arm, Patient Position: Sitting, Cuff Size: Normal)   Pulse 81   Temp (!) 97.4 F (36.3 C) (Oral)   Ht 5\' 10"  (1.778 m)   Wt 169 lb (76.7 kg)   BMI 24.25 kg/m    CC: AMW f/u visit Subjective:    Patient ID: Harry Ray, male    DOB: 11-Dec-1931, 85 y.o.   MRN: [id number]  HPI: Harry Ray is a 85 y.o. male presenting on 09/27/2018 for Follow-up   Saw Katha Cabal last week for medicare wellness visit. Note reviewed.   Seen 06/2018 for E visit for cough, treated with tessalon and albuterol and doxy course. Ongoing cough despite 100-200mg  tessalon perls. Yesterday had return of cough, last night awake due to cough spell. Ongoing symptoms since 06/2018. Cough mildly productive of mucous. No fevers/chills, dyspnea or wheezing. He does traditionally have spring allergies - this year seems well controlled with prn rare claritin. No GERD symptoms   He has been walking 1-2 miles a day.    BPH with LUTS - on flomax 0.4mg  daily, finasteride 5mg  daily. Continues low dose oxybutynin. Feels doing well with this.   Recent carotid US showed 1.2 cm possible thyroid nodule  Preventative: COLONOSCOPY 08/07/2005 Diverticulosis(Colon) Prostate cancer screening -see above Lung cancer screening -not eligible Flu shot -yearly Tdap 2016 Pneumovax 2011, prevnar 2016 zostavax -2011 shingrix - discussed HCPOA/Advanced directive scanned into chart 08/2016 - Wife Harry Ray is HCPOA then son Camila Li or daughter Carmell [place]. Grants discretion to [facility] for end of life decisions.  Seat belt use discussed  Sunscreen use  discussed, no changing moles on skin. Sees derm yearly Non smoker Alcohol - none Normal BMs.   Lives with wife at [facility], no pets Son and family live in [place]: retired, was [employer] Activity: [employer] exercise program 2x/wk, enjoys walking Diet: good water, fruits/vegetables daily     Relevant past medical, surgical, family and social history reviewed and updated as indicated. Interim medical history since our last visit reviewed. Allergies and medications reviewed and updated. Outpatient Medications Prior to Visit  Medication Sig Dispense Refill  . benzonatate (TESSALON PERLES) 100 MG capsule Take 1-2 capsules (100-200 mg total) by mouth every 8 (eight) hours as needed for cough. 30 capsule 0  . finasteride (PROSCAR) 5 MG tablet TAKE 1 TABLET BY MOUTH EVERY DAY 90 tablet 1  . fluticasone (FLONASE) 50 MCG/ACT nasal spray Place 2 sprays into both nostrils daily as needed for allergies or rhinitis.    . tamsulosin (FLOMAX) 0.4 MG CAPS capsule TAKE 2 CAPSULES BY MOUTH EVERY DAY 180 capsule 1  . doxycycline (VIBRA-TABS) 100 MG tablet Take 1 tablet (100 mg total) by mouth 2 (two) times daily. 14 tablet 0  . oxybutynin (DITROPAN) 5 MG tablet Take 1 tablet (5 mg total) by mouth at bedtime as needed (nocturia). (Patient taking differently: Take 2.5 mg by mouth at bedtime as needed (nocturia). ) 30 tablet 6   No facility-administered medications prior to visit.      Per HPI unless specifically indicated in ROS section below Review of Systems Objective:  BP 140/74 (BP Location: Left Arm, Patient Position: Sitting, Cuff Size: Normal)   Pulse 81   Temp (!) 97.4 F (36.3 C) (Oral)   Ht 5\' 10"  (1.778 m)   Wt 169 lb (76.7 kg)   BMI 24.25 kg/m   Wt Readings from Last 3 Encounters:  09/27/18 169 lb (76.7 kg)  06/22/18 171 lb 12 oz (77.9 kg)  05/03/18 171 lb (77.6 kg)     Physical exam: Gen: alert, NAD, not ill appearing Pulm: speaks in complete  sentences without increased work of breathing Psych: normal mood, normal thought content      Results for orders placed or performed in visit on 09/23/18  PSA  Result Value Ref Range   PSA 0.45 0.10 - 4.00 ng/mL  TSH  Result Value Ref Range   TSH 1.17 0.35 - 4.50 uIU/mL  Basic metabolic panel  Result Value Ref Range   Sodium 139 135 - 145 mEq/L   Potassium 4.3 3.5 - 5.1 mEq/L   Chloride 104 96 - 112 mEq/L   CO2 26 19 - 32 mEq/L   Glucose, Bld 85 70 - 99 mg/dL   BUN 16 6 - 23 mg/dL   Creatinine, Ser 0.95 0.40 - 1.50 mg/dL   Calcium 9.0 8.4 - 10.5 mg/dL   GFR 75.34 >60.00 mL/min  Lipid panel  Result Value Ref Range   Cholesterol 156 0 - 200 mg/dL   Triglycerides 112.0 0.0 - 149.0 mg/dL   HDL 37.30 (L) >39.00 mg/dL   VLDL 22.4 0.0 - 40.0 mg/dL   LDL Cholesterol 96 0 - 99 mg/dL   Total CHOL/HDL Ratio 4    NonHDL 118.69    Assessment & Plan:   Problem List Items Addressed This Visit    Right thyroid nodule    Update thyroid US      Relevant Orders   US THYROID   Dyslipidemia    Mildly low HDL - encouraged aerobic exercise.       Dizziness    Stable period      Cough    Ongoing since respiratory illness late jan 2020. Advised come in for CXR if persists.       Carotid stenosis    Latest carotid US reassuring. Consider updated next year.       BPH (benign prostatic hyperplasia) - Primary    Chronic, stable on current regimen of flomax once daily, finasteride, and oxybutynin 2.5mg  nightly. Continue. PSA stable.           Meds ordered this encounter  Medications  . loratadine (CLARITIN) 10 MG tablet    Sig: Take 1 tablet (10 mg total) by mouth daily as needed for allergies.  Marland Kitchen oxybutynin (DITROPAN) 5 MG tablet    Sig: Take 0.5 tablets (2.5 mg total) by mouth at bedtime as needed (nocturia).    Dispense:  30 tablet    Refill:  6   Orders Placed This Encounter  Procedures  . US THYROID    Standing Status:   Future    Standing Expiration Date:    11/27/2019    Order Specific Question:   Reason for Exam (SYMPTOM  OR DIAGNOSIS REQUIRED)    Answer:   f/u thyroid nodule    Order Specific Question:   Preferred imaging location?    Answer:   [facility]    Follow up plan: Return in about 1 year (around 09/27/2019) for follow up visit, medicare wellness visit.  Ria Bush, MD

## 2018-09-27 NOTE — Progress Notes (Signed)
I reviewed health advisor's note, was available for consultation, and agree with documentation and plan.  

## 2018-09-27 NOTE — Assessment & Plan Note (Signed)
Chronic, stable on current regimen of flomax once daily, finasteride, and oxybutynin 2.5mg  nightly. Continue. PSA stable.

## 2018-09-27 NOTE — Assessment & Plan Note (Signed)
Mildly low HDL - encouraged aerobic exercise.

## 2018-09-27 NOTE — Patient Instructions (Addendum)
We will try and order thyroid ultrasound for this summer. Order placed today.  Let's watch cough - if ongoing or worsening, let me know for chest xray in office.  Good to see you today.  Schedule follow up in 1 year. Ok to cancel August appointment.

## 2018-09-27 NOTE — Assessment & Plan Note (Addendum)
Latest carotid US reassuring. Consider updated next year.

## 2018-10-07 ENCOUNTER — Encounter: Payer: Self-pay | Admitting: Family Medicine

## 2018-10-07 ENCOUNTER — Ambulatory Visit (INDEPENDENT_AMBULATORY_CARE_PROVIDER_SITE_OTHER)
Admission: RE | Admit: 2018-10-07 | Discharge: 2018-10-07 | Disposition: A | Discharge status: Home / Self Care | Payer: Medicare Other | Source: Ambulatory Visit | Attending: Family Medicine | Admitting: Family Medicine

## 2018-10-07 ENCOUNTER — Other Ambulatory Visit: Payer: Self-pay

## 2018-10-07 ENCOUNTER — Ambulatory Visit (INDEPENDENT_AMBULATORY_CARE_PROVIDER_SITE_OTHER): Payer: Medicare Other | Admitting: Family Medicine

## 2018-10-07 VITALS — BP 130/70 | HR 74 | Temp 98.1°F | Ht 70.0 in | Wt 169.0 lb

## 2018-10-07 DIAGNOSIS — R05 Cough: Secondary | ICD-10-CM | POA: Diagnosis not present

## 2018-10-07 DIAGNOSIS — I6529 Occlusion and stenosis of unspecified carotid artery: Secondary | ICD-10-CM

## 2018-10-07 DIAGNOSIS — R059 Cough, unspecified: Secondary | ICD-10-CM

## 2018-10-07 NOTE — Assessment & Plan Note (Addendum)
Ongoing nagging cough present for last 3-4 months, started after URI. Given duration of cough, I did recommend he come in for CXR which I have ordered. Pt will come in now. If normal, anticipate allergic cause - will rec start flonase + nasal saline.

## 2018-10-07 NOTE — Progress Notes (Signed)
Virtual visit completed through [employer]. Due to national recommendations of social distancing due to [place] 19, a virtual visit is felt to be most appropriate for this patient at this time.   Patient location: home Provider location: [place] at [facility], office If any vitals were documented, they were collected by patient at home unless specified below.    BP 130/70 (BP Location: Right Arm, Patient Position: Sitting)   Pulse 74   Temp 98.1 F (36.7 C) (Oral)   Ht 5\' 10"  (1.778 m)   Wt 169 lb (76.7 kg)   SpO2 99% Comment: RA  BMI 24.25 kg/m    O2 sat 99% in office  CC: cough Subjective:    Patient ID: Harry Ray, male    DOB: 12-24-1931, 85 y.o.   MRN: [id number]  HPI: Harry Ray is a 85 y.o. male presenting on 10/07/2018 for Cough (C/o persistent cough. )    Seen 06/2018 for E visit for cough, treated with tessalon and albuterol and doxy course. Ongoing cough despite 100-200mg  tessalon perls. Yesterday had return of cough, last night awake due to cough spell. Ongoing symptoms since 06/2018. Cough mildly productive of mucous. No fevers/chills, dyspnea or wheezing. He does traditionally have spring allergies - this year seems well controlled with prn rare claritin. No GERD symptoms   Ongoing cough productive of mild yellow sputum.  No fevers/chills, chest pain, pleurisy,. night sweats, weight loss. No PNdyspnea.  Tessalon perls help temporarily.  Allergies largely well controlled with claritin. Not currently using flonase.       Relevant past medical, surgical, family and social history reviewed and updated as indicated. Interim medical history since our last visit reviewed. Allergies and medications reviewed and updated. Outpatient Medications Prior to Visit  Medication Sig Dispense Refill  . finasteride (PROSCAR) 5 MG tablet TAKE 1 TABLET BY MOUTH EVERY DAY 90 tablet 1  . fluticasone (FLONASE) 50 MCG/ACT nasal spray Place 2 sprays into both nostrils daily as  needed for allergies or rhinitis.    Marland Kitchen loratadine (CLARITIN) 10 MG tablet Take 1 tablet (10 mg total) by mouth daily as needed for allergies.    Marland Kitchen oxybutynin (DITROPAN) 5 MG tablet Take 0.5 tablets (2.5 mg total) by mouth at bedtime as needed (nocturia). 30 tablet 6  . tamsulosin (FLOMAX) 0.4 MG CAPS capsule TAKE 2 CAPSULES BY MOUTH EVERY DAY 180 capsule 1  . benzonatate (TESSALON PERLES) 100 MG capsule Take 1-2 capsules (100-200 mg total) by mouth every 8 (eight) hours as needed for cough. 30 capsule 0   No facility-administered medications prior to visit.      Per HPI unless specifically indicated in ROS section below Review of Systems Objective:    BP 130/70 (BP Location: Right Arm, Patient Position: Sitting)   Pulse 74   Temp 98.1 F (36.7 C) (Oral)   Ht 5\' 10"  (1.778 m)   Wt 169 lb (76.7 kg)   SpO2 99% Comment: RA  BMI 24.25 kg/m   Wt Readings from Last 3 Encounters:  10/07/18 169 lb (76.7 kg)  09/27/18 169 lb (76.7 kg)  06/22/18 171 lb 12 oz (77.9 kg)     Physical exam: Gen: alert, NAD, not ill appearing Pulm: speaks in complete sentences without increased work of breathing --> he came into office for CXR at which time I listened to his lungs - CTAB without crackles/wheezing, rhonchi/rales.  Psych: normal mood, normal thought content      Assessment & Plan:   Problem  List Items Addressed This Visit    Cough - Primary    Ongoing nagging cough present for last 3-4 months, started after URI. Given duration of cough, I did recommend he come in for CXR which I have ordered. Pt will come in now. If normal, anticipate allergic cause - will rec start flonase + nasal saline.      Relevant Orders   DG Chest 2 View       No orders of the defined types were placed in this encounter.  Orders Placed This Encounter  Procedures  . DG Chest 2 View    Standing Status:   Future    Number of Occurrences:   1    Standing Expiration Date:   12/07/2019    Order Specific Question:    Reason for Exam (SYMPTOM  OR DIAGNOSIS REQUIRED)    Answer:   cough for months    Order Specific Question:   Preferred imaging location?    Answer:   [facility]    Order Specific Question:   Radiology Contrast Protocol - do NOT remove file path    Answer:   [email or web address]    Follow up plan: No follow-ups on file.  Ria Bush, MD

## 2018-10-07 NOTE — Telephone Encounter (Signed)
Noted  

## 2018-11-28 ENCOUNTER — Other Ambulatory Visit: Payer: Self-pay

## 2018-11-28 ENCOUNTER — Ambulatory Visit
Admission: RE | Admit: 2018-11-28 | Discharge: 2018-11-28 | Disposition: A | Discharge status: Home / Self Care | Payer: Medicare Other | Source: Ambulatory Visit | Attending: Family Medicine | Admitting: Family Medicine

## 2018-11-28 DIAGNOSIS — E041 Nontoxic single thyroid nodule: Secondary | ICD-10-CM | POA: Insufficient documentation

## 2018-11-28 DIAGNOSIS — E042 Nontoxic multinodular goiter: Secondary | ICD-10-CM | POA: Diagnosis not present

## 2018-12-12 DIAGNOSIS — X32XXXA Exposure to sunlight, initial encounter: Secondary | ICD-10-CM | POA: Diagnosis not present

## 2018-12-12 DIAGNOSIS — C4442 Squamous cell carcinoma of skin of scalp and neck: Secondary | ICD-10-CM | POA: Diagnosis not present

## 2018-12-12 DIAGNOSIS — L570 Actinic keratosis: Secondary | ICD-10-CM | POA: Diagnosis not present

## 2018-12-12 DIAGNOSIS — D485 Neoplasm of uncertain behavior of skin: Secondary | ICD-10-CM | POA: Diagnosis not present

## 2018-12-26 DIAGNOSIS — C4442 Squamous cell carcinoma of skin of scalp and neck: Secondary | ICD-10-CM | POA: Diagnosis not present

## 2019-01-13 ENCOUNTER — Telehealth: Payer: Self-pay | Admitting: Family Medicine

## 2019-01-13 NOTE — Telephone Encounter (Signed)
Patient called about upcoming appointment.  He stated he thought that it was discussed to cancel this visit when he came to see you last.  Patient would like to know if you are still wanting him to attend this visit or cancel   Pt c/b #

## 2019-01-13 NOTE — Telephone Encounter (Signed)
Yes ok to cancel august appt. Would just reschedule reschedule for next April.

## 2019-01-16 NOTE — Telephone Encounter (Signed)
Plz r/s 01/18/19 Medicare wellness for 09/2019, per Dr. Darnell Level.

## 2019-01-17 NOTE — Telephone Encounter (Signed)
Cancelled 01/18/19 appt and scheduled 09/25/18 [facility]Labs 10/02/18 with Dr. Darnell Level

## 2019-01-18 ENCOUNTER — Encounter: Payer: Medicare Other | Admitting: Family Medicine

## 2019-02-07 ENCOUNTER — Other Ambulatory Visit: Payer: Self-pay | Admitting: Family Medicine

## 2019-03-21 ENCOUNTER — Encounter: Payer: Self-pay | Admitting: Family Medicine

## 2019-03-24 ENCOUNTER — Encounter: Payer: Self-pay | Admitting: Family Medicine

## 2019-03-24 NOTE — Telephone Encounter (Signed)
Spoke with patient to express my condolences.

## 2019-03-28 ENCOUNTER — Ambulatory Visit (INDEPENDENT_AMBULATORY_CARE_PROVIDER_SITE_OTHER): Payer: Medicare Other

## 2019-03-28 DIAGNOSIS — Z23 Encounter for immunization: Secondary | ICD-10-CM

## 2019-04-24 DIAGNOSIS — D485 Neoplasm of uncertain behavior of skin: Secondary | ICD-10-CM | POA: Diagnosis not present

## 2019-04-24 DIAGNOSIS — L821 Other seborrheic keratosis: Secondary | ICD-10-CM | POA: Diagnosis not present

## 2019-04-24 DIAGNOSIS — L570 Actinic keratosis: Secondary | ICD-10-CM | POA: Diagnosis not present

## 2019-04-24 DIAGNOSIS — D2262 Melanocytic nevi of left upper limb, including shoulder: Secondary | ICD-10-CM | POA: Diagnosis not present

## 2019-04-24 DIAGNOSIS — Z85828 Personal history of other malignant neoplasm of skin: Secondary | ICD-10-CM | POA: Diagnosis not present

## 2019-04-24 DIAGNOSIS — D2261 Melanocytic nevi of right upper limb, including shoulder: Secondary | ICD-10-CM | POA: Diagnosis not present

## 2019-04-24 DIAGNOSIS — X32XXXA Exposure to sunlight, initial encounter: Secondary | ICD-10-CM | POA: Diagnosis not present

## 2019-04-24 DIAGNOSIS — D2271 Melanocytic nevi of right lower limb, including hip: Secondary | ICD-10-CM | POA: Diagnosis not present

## 2019-05-15 ENCOUNTER — Encounter: Payer: Self-pay | Admitting: Family Medicine

## 2019-05-17 MED ORDER — MIRABEGRON ER 25 MG PO TB24: 25.0000 mg | ORAL_TABLET | Freq: Every day | ORAL | 3 refills | Status: DC

## 2019-06-14 ENCOUNTER — Encounter: Payer: Self-pay | Admitting: Family Medicine

## 2019-06-16 ENCOUNTER — Encounter: Payer: Self-pay | Admitting: Family Medicine

## 2019-09-25 ENCOUNTER — Ambulatory Visit (INDEPENDENT_AMBULATORY_CARE_PROVIDER_SITE_OTHER): Payer: Medicare PPO

## 2019-09-25 ENCOUNTER — Other Ambulatory Visit (INDEPENDENT_AMBULATORY_CARE_PROVIDER_SITE_OTHER): Payer: Medicare PPO

## 2019-09-25 ENCOUNTER — Other Ambulatory Visit: Payer: Self-pay

## 2019-09-25 ENCOUNTER — Other Ambulatory Visit: Payer: Self-pay | Admitting: Family Medicine

## 2019-09-25 VITALS — Wt 163.0 lb

## 2019-09-25 DIAGNOSIS — Z0000 Encounter for general adult medical examination without abnormal findings: Secondary | ICD-10-CM

## 2019-09-25 DIAGNOSIS — E041 Nontoxic single thyroid nodule: Secondary | ICD-10-CM

## 2019-09-25 DIAGNOSIS — N401 Enlarged prostate with lower urinary tract symptoms: Secondary | ICD-10-CM

## 2019-09-25 DIAGNOSIS — E785 Hyperlipidemia, unspecified: Secondary | ICD-10-CM | POA: Diagnosis not present

## 2019-09-25 DIAGNOSIS — R3914 Feeling of incomplete bladder emptying: Secondary | ICD-10-CM

## 2019-09-25 LAB — LIPID PANEL
Cholesterol: 170 mg/dL (ref 0–200)
HDL: 53.6 mg/dL (ref >39.00)
LDL Cholesterol: 103 mg/dL — ABNORMAL HIGH (ref 0–99)
NonHDL: 116.07
Total CHOL/HDL Ratio: 3
Triglycerides: 65 mg/dL (ref 0.0–149.0)
VLDL: 13 mg/dL (ref 0.0–40.0)

## 2019-09-25 LAB — COMPREHENSIVE METABOLIC PANEL
ALT: 11 U/L (ref 0–53)
AST: 18 U/L (ref 0–37)
Albumin: 4.4 g/dL (ref 3.5–5.2)
Alkaline Phosphatase: 65 U/L (ref 39–117)
BUN: 16 mg/dL (ref 6–23)
CO2: 29 mEq/L (ref 19–32)
Calcium: 9.2 mg/dL (ref 8.4–10.5)
Chloride: 103 mEq/L (ref 96–112)
Creatinine, Ser: 0.94 mg/dL (ref 0.40–1.50)
GFR: 76.08 mL/min (ref >60.00)
Glucose, Bld: 87 mg/dL (ref 70–99)
Potassium: 4.2 mEq/L (ref 3.5–5.1)
Sodium: 139 mEq/L (ref 135–145)
Total Bilirubin: 0.7 mg/dL (ref 0.2–1.2)
Total Protein: 6.9 g/dL (ref 6.0–8.3)

## 2019-09-25 LAB — PSA: PSA: 0.44 ng/mL (ref 0.10–4.00)

## 2019-09-25 LAB — TSH: TSH: 1.49 u[IU]/mL (ref 0.35–4.50)

## 2019-09-25 NOTE — Patient Instructions (Signed)
Harry Ray , Thank you for taking time to come for your Medicare Wellness Visit. I appreciate your ongoing commitment to your health goals. Please review the following plan we discussed and let me know if I can assist you in the future.   Screening recommendations/referrals: Colonoscopy: no longer required Recommended yearly ophthalmology/optometry visit for glaucoma screening and checkup Recommended yearly dental visit for hygiene and checkup  Vaccinations: Influenza vaccine: Up to date, completed 03/28/2019 Pneumococcal vaccine: Completed series Tdap vaccine: Up to date, completed 08/06/2014 Shingles vaccine: discussed    Advanced directives: copy in chart per patient  Conditions/risks identified: hyperlipidemia  Next appointment: 10/02/2019 @ 10:30 am   Preventive Care 12 Years and Older, Male Preventive care refers to lifestyle choices and visits with your health care provider that can promote health and wellness. What does preventive care include?  A yearly physical exam. This is also called an annual well check.  Dental exams once or twice a year.  Routine eye exams. Ask your health care provider how often you should have your eyes checked.  Personal lifestyle choices, including:  Daily care of your teeth and gums.  Regular physical activity.  Eating a healthy diet.  Avoiding tobacco and drug use.  Limiting alcohol use.  Practicing safe sex.  Taking low doses of aspirin every day.  Taking vitamin and mineral supplements as recommended by your health care provider. What happens during an annual well check? The services and screenings done by your health care provider during your annual well check will depend on your age, overall health, lifestyle risk factors, and family history of disease. Counseling  Your health care provider may ask you questions about your:  Alcohol use.  Tobacco use.  Drug use.  Emotional well-being.  Home and relationship  well-being.  Sexual activity.  Eating habits.  History of falls.  Memory and ability to understand (cognition).  Work and work Statistician. Screening  You may have the following tests or measurements:  Height, weight, and BMI.  Blood pressure.  Lipid and cholesterol levels. These may be checked every 5 years, or more frequently if you are over 16 years old.  Skin check.  Lung cancer screening. You may have this screening every year starting at age 79 if you have a 30-pack-year history of smoking and currently smoke or have quit within the past 15 years.  Fecal occult blood test (FOBT) of the stool. You may have this test every year starting at age 38.  Flexible sigmoidoscopy or colonoscopy. You may have a sigmoidoscopy every 5 years or a colonoscopy every 10 years starting at age 59.  Prostate cancer screening. Recommendations will vary depending on your family history and other risks.  Hepatitis C blood test.  Hepatitis B blood test.  Sexually transmitted disease (STD) testing.  Diabetes screening. This is done by checking your blood sugar (glucose) after you have not eaten for a while (fasting). You may have this done every 1-3 years.  Abdominal aortic aneurysm (AAA) screening. You may need this if you are a current or former smoker.  Osteoporosis. You may be screened starting at age 3 if you are at high risk. Talk with your health care provider about your test results, treatment options, and if necessary, the need for more tests. Vaccines  Your health care provider may recommend certain vaccines, such as:  Influenza vaccine. This is recommended every year.  Tetanus, diphtheria, and acellular pertussis (Tdap, Td) vaccine. You may need a Td booster every 10  years.  Zoster vaccine. You may need this after age 32.  Pneumococcal 13-valent conjugate (PCV13) vaccine. One dose is recommended after age 27.  Pneumococcal polysaccharide (PPSV23) vaccine. One dose is  recommended after age 67. Talk to your health care provider about which screenings and vaccines you need and how often you need them. This information is not intended to replace advice given to you by your health care provider. Make sure you discuss any questions you have with your health care provider. Document Released: 06/21/2015 Document Revised: 02/12/2016 Document Reviewed: 03/26/2015 Elsevier Interactive Patient Education  2017 [place] Prevention in the Home Falls can cause injuries. They can happen to people of all ages. There are many things you can do to make your home safe and to help prevent falls. What can I do on the outside of my home?  Regularly fix the edges of walkways and driveways and fix any cracks.  Remove anything that might make you trip as you walk through a door, such as a raised step or threshold.  Trim any bushes or trees on the path to your home.  Use bright outdoor lighting.  Clear any walking paths of anything that might make someone trip, such as rocks or tools.  Regularly check to see if handrails are loose or broken. Make sure that both sides of any steps have handrails.  Any raised decks and porches should have guardrails on the edges.  Have any leaves, snow, or ice cleared regularly.  Use sand or salt on walking paths during winter.  Clean up any spills in your garage right away. This includes oil or grease spills. What can I do in the bathroom?  Use night lights.  Install grab bars by the toilet and in the tub and shower. Do not use towel bars as grab bars.  Use non-skid mats or decals in the tub or shower.  If you need to sit down in the shower, use a plastic, non-slip stool.  Keep the floor dry. Clean up any water that spills on the floor as soon as it happens.  Remove soap buildup in the tub or shower regularly.  Attach bath mats securely with double-sided non-slip rug tape.  Do not have throw rugs and other things on  the floor that can make you trip. What can I do in the bedroom?  Use night lights.  Make sure that you have a light by your bed that is easy to reach.  Do not use any sheets or blankets that are too big for your bed. They should not hang down onto the floor.  Have a firm chair that has side arms. You can use this for support while you get dressed.  Do not have throw rugs and other things on the floor that can make you trip. What can I do in the kitchen?  Clean up any spills right away.  Avoid walking on wet floors.  Keep items that you use a lot in easy-to-reach places.  If you need to reach something above you, use a strong step stool that has a grab bar.  Keep electrical cords out of the way.  Do not use floor polish or wax that makes floors slippery. If you must use wax, use non-skid floor wax.  Do not have throw rugs and other things on the floor that can make you trip. What can I do with my stairs?  Do not leave any items on the stairs.  Make sure that  there are handrails on both sides of the stairs and use them. Fix handrails that are broken or loose. Make sure that handrails are as long as the stairways.  Check any carpeting to make sure that it is firmly attached to the stairs. Fix any carpet that is loose or worn.  Avoid having throw rugs at the top or bottom of the stairs. If you do have throw rugs, attach them to the floor with carpet tape.  Make sure that you have a light switch at the top of the stairs and the bottom of the stairs. If you do not have them, ask someone to add them for you. What else can I do to help prevent falls?  Wear shoes that:  Do not have high heels.  Have rubber bottoms.  Are comfortable and fit you well.  Are closed at the toe. Do not wear sandals.  If you use a stepladder:  Make sure that it is fully opened. Do not climb a closed stepladder.  Make sure that both sides of the stepladder are locked into place.  Ask someone to  hold it for you, if possible.  Clearly mark and make sure that you can see:  Any grab bars or handrails.  First and last steps.  Where the edge of each step is.  Use tools that help you move around (mobility aids) if they are needed. These include:  Canes.  Walkers.  Scooters.  Crutches.  Turn on the lights when you go into a dark area. Replace any light bulbs as soon as they burn out.  Set up your furniture so you have a clear path. Avoid moving your furniture around.  If any of your floors are uneven, fix them.  If there are any pets around you, be aware of where they are.  Review your medicines with your doctor. Some medicines can make you feel dizzy. This can increase your chance of falling. Ask your doctor what other things that you can do to help prevent falls. This information is not intended to replace advice given to you by your health care provider. Make sure you discuss any questions you have with your health care provider. Document Released: 03/21/2009 Document Revised: 10/31/2015 Document Reviewed: 06/29/2014 Elsevier Interactive Patient Education  2017 [employer].

## 2019-09-25 NOTE — Progress Notes (Signed)
PCP notes:  Health Maintenance: No gaps noted   Abnormal Screenings: none   Patient concerns: none   Nurse concerns: none   Next PCP appt: 10/02/2019 @ 10:30 am

## 2019-09-25 NOTE — Progress Notes (Signed)
Subjective:   Harry Ray is a 86 y.o. male who presents for Medicare Annual/Subsequent preventive examination.  Review of Systems: N/A   This visit is being conducted through telemedicine via telephone at the nurse health advisor's home address due to the COVID-19 pandemic. This patient has given me verbal consent via doximity to conduct this visit, patient states they are participating from their home address. Patient and myself are on the telephone call. There is no referral for this visit. Some vital signs may be absent or patient reported.    Patient identification: identified by name, DOB, and current address   Cardiac Risk Factors include: advanced age (>25men, >60 women);male gender;dyslipidemia     Objective:    Vitals: Wt 163 lb (73.9 kg)   BMI 23.39 kg/m   Body mass index is 23.39 kg/m.  Advanced Directives 09/25/2019 09/23/2018 09/09/2017  Does Patient Have a Medical Advance Directive? Yes Yes Yes  Type of Paramedic of [place];Living will [place];Living will [place];Living will  Copy of [place] in Chart? Yes - validated most recent copy scanned in chart (See row information) Yes - validated most recent copy scanned in chart (See row information) Yes  Would patient like information on creating a medical advance directive? - No - Patient declined -    Tobacco Social History   Tobacco Use  Smoking Status Never Smoker  Smokeless Tobacco Never Used     Counseling given: Not Answered   Clinical Intake:  Pre-visit preparation completed: Yes  Pain : No/denies pain     Nutritional Risks: None Diabetes: No  How often do you need to have someone help you when you read instructions, pamphlets, or other written materials from your doctor or pharmacy?: 1 - Never What is the last grade level you completed in school?: graduate  Interpreter Needed?: No  Information entered by  :: CJohnson, LPN  Past Medical History:  Diagnosis Date  . Arthritis    pinkys bilaterally and left thumb  . Carotid stenosis 99991111   LICA 123456, RICA 123456, rpt 1 yr (09/2016)  . Heart murmur    minor  . History of asthma 1983   asthma attack as reaction to allergy medication (2d ICU stay)  . History of chicken pox   . Nephritis childhood  . Seasonal allergies    spring  . Urine incontinence    Past Surgical History:  Procedure Laterality Date  . COLONOSCOPY  08/07/2005   Diverticulosis (Colon in [place])  . SCROTAL SURGERY     ? benign cyst removal   Family History  Problem Relation Age of Onset  . Dementia Mother        Pick's disease  . Cancer Maternal Grandfather        stomach  . Cancer Paternal Uncle        throat - smoker  . Diabetes Neg Hx   . CAD Neg Hx    Social History   Socioeconomic History  . Marital status: Married    Spouse name: Not on file  . Number of children: Not on file  . Years of education: Not on file  . Highest education level: Not on file  Occupational History  . Not on file  Tobacco Use  . Smoking status: Never Smoker  . Smokeless tobacco: Never Used  Substance and Sexual Activity  . Alcohol use: No    Alcohol/week: 0.0 standard drinks  . Drug  use: No  . Sexual activity: Not on file  Other Topics Concern  . Not on file  Social History Narrative   Widower, lives at [facility], no pets    Was married for 59 yrs    Wife suddenly passed away Mar 07, 2019    Son and family live in [place]: retired, was [employer]   Activity: starting [place] exercise program 2x/wk, enjoys walking   Social Determinants of Health   Financial Resource Strain: Low Risk   . Difficulty of Paying Living Expenses: Not hard at all  Food Insecurity: No Food Insecurity  . Worried About Charity fundraiser in the Last Year: Never true  . Ran Out of Food in the Last Year: Never true  Transportation Needs: No Transportation  Needs  . Lack of Transportation (Medical): No  . Lack of Transportation (Non-Medical): No  Physical Activity: Sufficiently Active  . Days of Exercise per Week: 7 days  . Minutes of Exercise per Session: 50 min  Stress: Stress Concern Present  . Feeling of Stress : To some extent  Social Connections:   . Frequency of Communication with Friends and Family:   . Frequency of Social Gatherings with Friends and Family:   . Attends Religious Services:   . Active Member of Clubs or Organizations:   . Attends Archivist Meetings:   Marland Kitchen Marital Status:     Outpatient Encounter Medications as of 09/25/2019  Medication Sig  . finasteride (PROSCAR) 5 MG tablet TAKE 1 TABLET BY MOUTH EVERY DAY  . fluticasone (FLONASE) 50 MCG/ACT nasal spray Place 2 sprays into both nostrils daily as needed for allergies or rhinitis.  Marland Kitchen loratadine (CLARITIN) 10 MG tablet Take 1 tablet (10 mg total) by mouth daily as needed for allergies.  . mirabegron ER (MYRBETRIQ) 25 MG TB24 tablet Take 1 tablet (25 mg total) by mouth daily.  Marland Kitchen oxybutynin (DITROPAN) 5 MG tablet Take 0.5 tablets (2.5 mg total) by mouth at bedtime as needed (nocturia).  . tamsulosin (FLOMAX) 0.4 MG CAPS capsule Take 1 capsule (0.4 mg total) by mouth daily.   No facility-administered encounter medications on file as of 09/25/2019.    Activities of Daily Living In your present state of health, do you have any difficulty performing the following activities: 09/25/2019  Hearing? N  Vision? N  Difficulty concentrating or making decisions? N  Walking or climbing stairs? N  Dressing or bathing? N  Doing errands, shopping? N  Preparing Food and eating ? N  Using the Toilet? N  In the past six months, have you accidently leaked urine? Y  Comment some leakage, takes medication  Do you have problems with loss of bowel control? N  Managing your Medications? N  Managing your Finances? N  Housekeeping or managing your Housekeeping? N  Some  recent data might be hidden    Patient Care Team: Ria Bush, MD as PCP - General (Family Medicine)   Assessment:   This is a routine wellness examination for Harry Ray.  Exercise Activities and Dietary recommendations Current Exercise Habits: Home exercise routine, Type of exercise: walking, Time (Minutes): 45, Frequency (Times/Week): 7, Weekly Exercise (Minutes/Week): 315, Intensity: Moderate, Exercise limited by: None identified  Goals    . Patient Stated     Starting 09/23/18, I will continue take medications as prescribed.     . Patient Stated     09/25/2019, I will continue walking everyday for about 2 miles.  Fall Risk Fall Risk  09/25/2019 09/23/2018 09/09/2017 08/25/2016  Falls in the past year? 0 0 No No  Number falls in past yr: 0 - - -  Injury with Fall? 0 - - -  Risk for fall due to : No Fall Risks - - -  Follow up Falls evaluation completed;Falls prevention discussed - - -   Is the patient's home free of loose throw rugs in walkways, pet beds, electrical cords, etc?   yes      Grab bars in the bathroom? yes      Handrails on the stairs?   yes      Adequate lighting?   yes  Timed Get Up and Go Performed: N/A  Depression Screen PHQ 2/9 Scores 09/25/2019 09/23/2018 09/09/2017 08/25/2016  PHQ - 2 Score 2 0 0 0  PHQ- 9 Score 2 0 0 -    Cognitive Function MMSE - Mini Mental State Exam 09/25/2019 09/23/2018 09/09/2017  Orientation to time 5 5 5   Orientation to Place 5 5 5   Registration 3 3 3   Attention/ Calculation 5 0 0  Recall 3 3 3   Language- name 2 objects - 0 0  Language- repeat 1 1 1   Language- follow 3 step command - 0 3  Language- read & follow direction - 0 0  Write a sentence - 0 0  Copy design - 0 0  Total score - 17 20  Mini Cog  Mini-Cog screen was completed. Maximum score is 22. A value of 0 denotes this part of the MMSE was not completed or the patient failed this part of the Mini-Cog screening.       Immunization History  Administered  Date(s) Administered  . Fluad Quad(high Dose 65+) 03/28/2019  . H1N1 05/19/2008  . Influenza Inj Mdck Quad Pf 04/29/2015  . Influenza Split 03/06/2008, 03/27/2011, 04/28/2012, 04/05/2013, 03/21/2014  . Influenza,inj,Quad PF,6+ Mos 02/18/2016, 03/03/2017, 03/17/2018  . Influenza-Unspecified 04/29/2015  . Pneumococcal Conjugate-13 07/17/2014  . Pneumococcal Polysaccharide-23 04/09/2010  . Tdap 08/06/2014  . Zoster 05/15/2010    Qualifies for Shingles Vaccine: Yes  Screening Tests Health Maintenance  Topic Date Due  . DTAP VACCINES (1) 10/28/1931  . COVID-19 Vaccine (1) Never done  . INFLUENZA VACCINE  01/07/2020  . DTaP/Tdap/Td (2 - Td) 08/05/2024  . TETANUS/TDAP  08/05/2024  . PNA vac Low Risk Adult  Completed   Cancer Screenings: Lung: Low Dose CT Chest recommended if Age 74-80 years, 30 pack-year currently smoking OR have quit w/in 15years. Patient does not qualify. Colorectal: no longer required  Additional Screenings:  Hepatitis C Screening: N/A      Plan:   Patient will continue to walk everyday for about 2 miles.   I have personally reviewed and noted the following in the patient's chart:   . Medical and social history . Use of alcohol, tobacco or illicit drugs  . Current medications and supplements . Functional ability and status . Nutritional status . Physical activity . Advanced directives . List of other physicians . Hospitalizations, surgeries, and ER visits in previous 12 months . Vitals . Screenings to include cognitive, depression, and falls . Referrals and appointments  In addition, I have reviewed and discussed with patient certain preventive protocols, quality metrics, and best practice recommendations. A written personalized care plan for preventive services as well as general preventive health recommendations were provided to patient.     Andrez Grime, LPN  [id number]

## 2019-09-27 ENCOUNTER — Encounter: Payer: Self-pay | Admitting: Family Medicine

## 2019-10-01 ENCOUNTER — Other Ambulatory Visit: Payer: Self-pay | Admitting: Family Medicine

## 2019-10-02 ENCOUNTER — Ambulatory Visit (INDEPENDENT_AMBULATORY_CARE_PROVIDER_SITE_OTHER): Payer: Medicare PPO | Admitting: Family Medicine

## 2019-10-02 ENCOUNTER — Encounter: Payer: Self-pay | Admitting: Family Medicine

## 2019-10-02 ENCOUNTER — Other Ambulatory Visit: Payer: Self-pay

## 2019-10-02 VITALS — BP 126/66 | HR 65 | Temp 97.9°F | Ht 69.5 in | Wt 165.0 lb

## 2019-10-02 DIAGNOSIS — N401 Enlarged prostate with lower urinary tract symptoms: Secondary | ICD-10-CM

## 2019-10-02 DIAGNOSIS — R011 Cardiac murmur, unspecified: Secondary | ICD-10-CM

## 2019-10-02 DIAGNOSIS — E041 Nontoxic single thyroid nodule: Secondary | ICD-10-CM

## 2019-10-02 DIAGNOSIS — Z0000 Encounter for general adult medical examination without abnormal findings: Secondary | ICD-10-CM | POA: Insufficient documentation

## 2019-10-02 DIAGNOSIS — R159 Full incontinence of feces: Secondary | ICD-10-CM | POA: Insufficient documentation

## 2019-10-02 DIAGNOSIS — I6529 Occlusion and stenosis of unspecified carotid artery: Secondary | ICD-10-CM | POA: Diagnosis not present

## 2019-10-02 DIAGNOSIS — Z7189 Other specified counseling: Secondary | ICD-10-CM

## 2019-10-02 DIAGNOSIS — E785 Hyperlipidemia, unspecified: Secondary | ICD-10-CM

## 2019-10-02 DIAGNOSIS — R3914 Feeling of incomplete bladder emptying: Secondary | ICD-10-CM

## 2019-10-02 MED ORDER — TAMSULOSIN HCL 0.4 MG PO CAPS: 0.4000 mg | ORAL_CAPSULE | Freq: Every day | ORAL | 3 refills | Status: DC

## 2019-10-02 MED ORDER — MIRABEGRON ER 25 MG PO TB24: 25.0000 mg | ORAL_TABLET | Freq: Every day | ORAL | 11 refills | Status: DC

## 2019-10-02 NOTE — Patient Instructions (Addendum)
You will be due for repeat thyroid ultrasound 11/2018. Send me a mychart message if we don't contact you for this.  If interested, check with pharmacy about new 2 shot shingles series (shingrix).  You are doing well today.  Return as needed or in 1 year for next physical and wellness visit.  Health Maintenance After Age 84 After age 17, you are at a higher risk for certain long-term diseases and infections as well as injuries from falls. Falls are a major cause of broken bones and head injuries in people who are older than age 54. Getting regular preventive care can help to keep you healthy and well. Preventive care includes getting regular testing and making lifestyle changes as recommended by your health care provider. Talk with your health care provider about:  Which screenings and tests you should have. A screening is a test that checks for a disease when you have no symptoms.  A diet and exercise plan that is right for you. What should I know about screenings and tests to prevent falls? Screening and testing are the best ways to find a health problem early. Early diagnosis and treatment give you the best chance of managing medical conditions that are common after age 76. Certain conditions and lifestyle choices may make you more likely to have a fall. Your health care provider may recommend:  Regular vision checks. Poor vision and conditions such as cataracts can make you more likely to have a fall. If you wear glasses, make sure to get your prescription updated if your vision changes.  Medicine review. Work with your health care provider to regularly review all of the medicines you are taking, including over-the-counter medicines. Ask your health care provider about any side effects that may make you more likely to have a fall. Tell your health care provider if any medicines that you take make you feel dizzy or sleepy.  Osteoporosis screening. Osteoporosis is a condition that causes the bones  to get weaker. This can make the bones weak and cause them to break more easily.  Blood pressure screening. Blood pressure changes and medicines to control blood pressure can make you feel dizzy.  Strength and balance checks. Your health care provider may recommend certain tests to check your strength and balance while standing, walking, or changing positions.  Foot health exam. Foot pain and numbness, as well as not wearing proper footwear, can make you more likely to have a fall.  Depression screening. You may be more likely to have a fall if you have a fear of falling, feel emotionally low, or feel unable to do activities that you used to do.  Alcohol use screening. Using too much alcohol can affect your balance and may make you more likely to have a fall. What actions can I take to lower my risk of falls? General instructions  Talk with your health care provider about your risks for falling. Tell your health care provider if: ? You fall. Be sure to tell your health care provider about all falls, even ones that seem minor. ? You feel dizzy, sleepy, or off-balance.  Take over-the-counter and prescription medicines only as told by your health care provider. These include any supplements.  Eat a healthy diet and maintain a healthy weight. A healthy diet includes low-fat dairy products, low-fat (lean) meats, and fiber from whole grains, beans, and lots of fruits and vegetables. Home safety  Remove any tripping hazards, such as rugs, cords, and clutter.  Install safety equipment  such as grab bars in bathrooms and safety rails on stairs.  Keep rooms and walkways well-lit. Activity   Follow a regular exercise program to stay fit. This will help you maintain your balance. Ask your health care provider what types of exercise are appropriate for you.  If you need a cane or walker, use it as recommended by your health care provider.  Wear supportive shoes that have nonskid  soles. Lifestyle  Do not drink alcohol if your health care provider tells you not to drink.  If you drink alcohol, limit how much you have: ? 0-1 drink a day for women. ? 0-2 drinks a day for men.  Be aware of how much alcohol is in your drink. In the [place] one drink equals one typical bottle of beer (12 oz), one-half glass of wine (5 oz), or one shot of hard liquor (1 oz).  Do not use any products that contain nicotine or tobacco, such as cigarettes and e-cigarettes. If you need help quitting, ask your health care provider. Summary  Having a healthy lifestyle and getting preventive care can help to protect your health and wellness after age 84.  Screening and testing are the best way to find a health problem early and help you avoid having a fall. Early diagnosis and treatment give you the best chance for managing medical conditions that are more common for people who are older than age 52.  Falls are a major cause of broken bones and head injuries in people who are older than age 18. Take precautions to prevent a fall at home.  Work with your health care provider to learn what changes you can make to improve your health and wellness and to prevent falls. This information is not intended to replace advice given to you by your health care provider. Make sure you discuss any questions you have with your health care provider. Document Revised: 09/15/2018 Document Reviewed: 04/07/2017 Elsevier Patient Education  2020 [employer].

## 2019-10-02 NOTE — Addendum Note (Signed)
Addended by: Ria Bush on: 10/02/2019 11:20 AM   Modules accepted: Orders

## 2019-10-02 NOTE — Assessment & Plan Note (Addendum)
Chronic, very mild, stable off medication. HDL significantly improved.

## 2019-10-02 NOTE — Assessment & Plan Note (Addendum)
No bruits appreciated today. Latest carotid US WNL (07/2017)

## 2019-10-02 NOTE — Assessment & Plan Note (Signed)
HCPOA/Advanced directive scanned into chart 08/2016 - son Camila Li or daughter Carmell [place] are [place]. Grants discretion to [facility] for end of life decisions.

## 2019-10-02 NOTE — Progress Notes (Addendum)
This visit was conducted in person.  BP 126/66 (BP Location: Left Arm, Patient Position: Sitting, Cuff Size: Normal)   Pulse 65   Temp 97.9 F (36.6 C) (Temporal)   Ht 5' 9.5" (1.765 m)   Wt 165 lb (74.8 kg)   SpO2 99%   BMI 24.02 kg/m    CC: CPE Subjective:    Patient ID: Harry Ray, male    DOB: August 21, 1931, 86 y.o.   MRN: [id number]  HPI: Harry Ray is a 86 y.o. male presenting on 10/02/2019 for Annual Exam (Prt 2.)   Saw health advisor last week for medicare wellness visit. Note reviewed.   No exam data present    Clinical Support from 09/25/2019 in [place] at [place]  PHQ-2 Total Score  2      Fall Risk  09/25/2019 09/23/2018 09/09/2017 08/25/2016  Falls in the past year? 0 0 No No  Number falls in past yr: 0 - - -  Injury with Fall? 0 - - -  Risk for fall due to : No Fall Risks - - -  Follow up Falls evaluation completed;Falls prevention discussed - - -      BPH with LUTS - on flomax 0.4mg  daily, finasteride 5mg  daily. Stopped low dose oxybutynin (too much xerostomia), started myrbetriq 25mg  in its place with more benefit.   Disconcerting episode [id number] of stool incontinence after sudden urge - loose stool - when he got up to go to the bathroom had episode of incontinence. That morning he had made a fruit smoothie then ate a tangerine - ?overeating. He also had accidentally been taking 2 zyrtec/day for 10 days. Denies back pain, numbness, weakness.   Stable R thyroid nodule on latest imaging 11/2018 - rec yearly ultrasound fof 5 yrs (started 4/20219).   CXR back 2020 showed possible anterior wedging of lower thoracic vertebrae. No known h/o compression fracture.   Preventative: COLONOSCOPY 08/07/2005 Diverticulosis(Colon) - age out.  Prostate cancer screening -see above Lung cancer screening -not eligible Flu shot -yearly Tdap 2016 Pneumovax 2011, prevnar 2016 zostavax-2011 COVID vaccine - completed Moderna, unsure dates - will let us  know shingrix - discussed  HCPOA/Advanced directive scanned into chart 08/2016 - son Harry Ray or daughter Harry Ray are [place]. Grants discretion to [facility] for end of life decisions.Daughter lives in [place].  Seat belt use discussed  Sunscreen use discussed, no changing moles on skin. 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 Sees derm yearly Eye exam yearly Dentist yearly Non smoker Alcohol - none Bladder - see above Bowel - see above. No constipation  Widower, at [facility] [employer], no pets Was married for 61 yrs. Wife suddenly passed away on operating table 02/2019.  Son and family live in [place]. Daughter in [place] Occ: retired, was [employer] Activity: [place] exercise program (less this year), walking 1-2 mi/day Diet: good water, fruits/vegetables daily     Relevant past medical, surgical, family and social history reviewed and updated as indicated. Interim medical history since our last visit reviewed. Allergies and medications reviewed and updated. Outpatient Medications Prior to Visit  Medication Sig Dispense Refill  . cetirizine (ZYRTEC) 10 MG tablet Take 10 mg by mouth daily as needed for allergies. For seasonal allergies    . finasteride (PROSCAR) 5 MG tablet TAKE 1 TABLET BY MOUTH EVERY DAY 90 tablet 2  . fluticasone (FLONASE) 50 MCG/ACT nasal spray Place 2 sprays into both nostrils daily as needed for allergies or rhinitis.    Marland Kitchen  mirabegron ER (MYRBETRIQ) 25 MG TB24 tablet Take 1 tablet (25 mg total) by mouth daily. 30 tablet 3  . tamsulosin (FLOMAX) 0.4 MG CAPS capsule Take 1 capsule (0.4 mg total) by mouth daily.    Marland Kitchen loratadine (CLARITIN) 10 MG tablet Take 1 tablet (10 mg total) by mouth daily as needed for allergies.    Marland Kitchen oxybutynin (DITROPAN) 5 MG tablet Take 0.5 tablets (2.5 mg total) by mouth at bedtime as needed (nocturia). 30 tablet 6   No facility-administered medications prior to visit.     Per HPI unless specifically indicated in ROS section  below Review of Systems  Constitutional: Negative for activity change, appetite change, chills, fatigue, fever and unexpected weight change.  HENT: Negative for hearing loss.   Eyes: Negative for visual disturbance.  Respiratory: Negative for cough, chest tightness, shortness of breath and wheezing.   Cardiovascular: Negative for chest pain, palpitations and leg swelling.  Gastrointestinal: Negative for abdominal distention, abdominal pain, blood in stool, constipation, diarrhea, nausea and vomiting.       See stool incontinence episode per HPI  Genitourinary: Negative for difficulty urinating and hematuria.  Musculoskeletal: Negative for arthralgias, myalgias and neck pain.  Skin: Negative for rash.  Neurological: Negative for dizziness, seizures, syncope and headaches.  Hematological: Negative for adenopathy. Does not bruise/bleed easily.  Psychiatric/Behavioral: Negative for dysphoric mood. The patient is not nervous/anxious.    Objective:    BP 126/66 (BP Location: Left Arm, Patient Position: Sitting, Cuff Size: Normal)   Pulse 65   Temp 97.9 F (36.6 C) (Temporal)   Ht 5' 9.5" (1.765 m)   Wt 165 lb (74.8 kg)   SpO2 99%   BMI 24.02 kg/m   Wt Readings from Last 3 Encounters:  10/02/19 165 lb (74.8 kg)  09/25/19 163 lb (73.9 kg)  10/07/18 169 lb (76.7 kg)    Physical Exam Vitals and nursing note reviewed.  Constitutional:      General: He is not in acute distress.    Appearance: Normal appearance. He is well-developed. He is not ill-appearing.  HENT:     Right Ear: Hearing, tympanic membrane, ear canal and external ear normal.     Left Ear: Hearing, tympanic membrane, ear canal and external ear normal.  Eyes:     General: No scleral icterus.    Extraocular Movements: Extraocular movements intact.     Conjunctiva/sclera: Conjunctivae normal.     Pupils: Pupils are equal, round, and reactive to light.  Neck:     Thyroid: Thyroid mass (small R lower thyroid nodule)  present. No thyromegaly or thyroid tenderness.     Vascular: No carotid bruit.  Cardiovascular:     Rate and Rhythm: Normal rate and regular rhythm.     Pulses: Normal pulses.          Radial pulses are 2+ on the right side and 2+ on the left side.     Heart sounds: Normal heart sounds. No murmur.  Pulmonary:     Effort: Pulmonary effort is normal. No respiratory distress.     Breath sounds: Normal breath sounds. No wheezing, rhonchi or rales.  Abdominal:     General: Abdomen is flat. Bowel sounds are normal. There is no distension.     Palpations: Abdomen is soft. There is no mass.     Tenderness: There is no abdominal tenderness. There is no guarding or rebound.     Hernia: No hernia is present.  Genitourinary:    Pubic Area: No  rash.      Penis: Normal and uncircumcised.      Testes: Normal.        Right: Mass, tenderness or swelling not present. Right testis is descended.        Left: Mass, tenderness or swelling not present. Left testis is descended.     Prostate: Enlarged (50gm). Not tender and no nodules present.     Rectum: Normal. No mass, tenderness, anal fissure, external hemorrhoid or internal hemorrhoid. Normal anal tone.     Comments: Small blackhead cyst to R lateral scrotal wall into thigh without erythema or tenderness Musculoskeletal:        General: Normal range of motion.     Cervical back: Normal range of motion and neck supple.  Lymphadenopathy:     Cervical: No cervical adenopathy.  Skin:    General: Skin is warm and dry.     Findings: No rash.  Neurological:     General: No focal deficit present.     Mental Status: He is alert and oriented to person, place, and time.     Comments: CN grossly intact, station and gait intact  Psychiatric:        Mood and Affect: Mood normal.        Behavior: Behavior normal.        Thought Content: Thought content normal.        Judgment: Judgment normal.       Results for orders placed or performed in visit on  09/25/19  PSA  Result Value Ref Range   PSA 0.44 0.10 - 4.00 ng/mL  TSH  Result Value Ref Range   TSH 1.49 0.35 - 4.50 uIU/mL  Comprehensive metabolic panel  Result Value Ref Range   Sodium 139 135 - 145 mEq/L   Potassium 4.2 3.5 - 5.1 mEq/L   Chloride 103 96 - 112 mEq/L   CO2 29 19 - 32 mEq/L   Glucose, Bld 87 70 - 99 mg/dL   BUN 16 6 - 23 mg/dL   Creatinine, Ser 0.94 0.40 - 1.50 mg/dL   Total Bilirubin 0.7 0.2 - 1.2 mg/dL   Alkaline Phosphatase 65 39 - 117 U/L   AST 18 0 - 37 U/L   ALT 11 0 - 53 U/L   Total Protein 6.9 6.0 - 8.3 g/dL   Albumin 4.4 3.5 - 5.2 g/dL   GFR 76.08 >60.00 mL/min   Calcium 9.2 8.4 - 10.5 mg/dL  Lipid panel  Result Value Ref Range   Cholesterol 170 0 - 200 mg/dL   Triglycerides 65.0 0.0 - 149.0 mg/dL   HDL 53.60 >39.00 mg/dL   VLDL 13.0 0.0 - 40.0 mg/dL   LDL Cholesterol 103 (H) 0 - 99 mg/dL   Total CHOL/HDL Ratio 3    NonHDL 116.07    Assessment & Plan:  This visit occurred during the SARS-CoV-2 public health emergency.  Safety protocols were in place, including screening questions prior to the visit, additional usage of staff PPE, and extensive cleaning of exam room while observing appropriate contact time as indicated for disinfecting solutions.   Problem List Items Addressed This Visit    Right thyroid nodule    Reviewed with patient - will be due for rpt [place] 11/2019.       Relevant Orders   US THYROID   Heart murmur    Not appreciated      Health maintenance examination - Primary    Preventative protocols reviewed and updated unless pt  declined. Discussed healthy diet and lifestyle.       Dyslipidemia    Chronic, very mild, stable off medication. HDL significantly improved.       Carotid stenosis    No bruits appreciated today. Latest carotid US WNL (07/2017)      BPH (benign prostatic hyperplasia)    Chronic, stable period on finasteride, flomax, myrbetriq. Wonders about coming off myrbetriq - discussed trial off myrbetriq vs  trial off flomax - he will consider options.  Marked enlarged prostate on DRE today.  IPSS = 11-1      Relevant Medications   tamsulosin (FLOMAX) 0.4 MG CAPS capsule   Bowel incontinence    Isolated episode of fecal incontinence after high fiber intake one morning. Benign DRE today. Will continue to monitor, he will let me know if recurrent episode to consider further eval ?GI.      Advanced care planning/counseling discussion    HCPOA/Advanced directive scanned into chart 08/2016 - son Harry Ray or daughter Harry Ray are [place]. Grants discretion to [facility] for end of life decisions.          Meds ordered this encounter  Medications  . mirabegron ER (MYRBETRIQ) 25 MG TB24 tablet    Sig: Take 1 tablet (25 mg total) by mouth daily.    Dispense:  30 tablet    Refill:  11  . tamsulosin (FLOMAX) 0.4 MG CAPS capsule    Sig: Take 1 capsule (0.4 mg total) by mouth daily.    Dispense:  90 capsule    Refill:  3   Orders Placed This Encounter  Procedures  . US THYROID    Standing Status:   Future    Standing Expiration Date:   12/01/2020    Scheduling Instructions:     schedule after 11/28/2019    Order Specific Question:   Reason for Exam (SYMPTOM  OR DIAGNOSIS REQUIRED)    Answer:   R thyroid nodule    Order Specific Question:   Preferred imaging location?    Answer:   [facility]   Patient instructions: You will be due for repeat thyroid ultrasound 11/2018. Send me a mychart message if we don't contact you for this.  If interested, check with pharmacy about new 2 shot shingles series (shingrix).  You are doing well today.  Return as needed or in 1 year for next physical and wellness visit.  Follow up plan: Return in about 1 year (around 10/01/2020), or if symptoms worsen or fail to improve, for medicare wellness visit, annual exam, prior fasting for blood work.  Ria Bush, MD

## 2019-10-02 NOTE — Assessment & Plan Note (Addendum)
Chronic, stable period on finasteride, flomax, myrbetriq. Wonders about coming off myrbetriq - discussed trial off myrbetriq vs trial off flomax - he will consider options.  Marked enlarged prostate on DRE today.  IPSS = 11-1

## 2019-10-02 NOTE — Assessment & Plan Note (Signed)
Not appreciated 

## 2019-10-02 NOTE — Assessment & Plan Note (Signed)
Reviewed with patient - will be due for rpt [place] 11/2019.

## 2019-10-02 NOTE — Assessment & Plan Note (Signed)
Isolated episode of fecal incontinence after high fiber intake one morning. Benign DRE today. Will continue to monitor, he will let me know if recurrent episode to consider further eval ?GI.

## 2019-10-02 NOTE — Assessment & Plan Note (Signed)
Preventative protocols reviewed and updated unless pt declined. Discussed healthy diet and lifestyle.  

## 2019-11-04 ENCOUNTER — Other Ambulatory Visit: Payer: Self-pay | Admitting: Family Medicine

## 2019-11-30 ENCOUNTER — Ambulatory Visit
Admission: RE | Admit: 2019-11-30 | Discharge: 2019-11-30 | Disposition: A | Discharge status: Home / Self Care | Payer: Medicare PPO | Source: Ambulatory Visit | Attending: Family Medicine | Admitting: Family Medicine

## 2019-11-30 ENCOUNTER — Other Ambulatory Visit: Payer: Self-pay

## 2019-11-30 DIAGNOSIS — E041 Nontoxic single thyroid nodule: Secondary | ICD-10-CM | POA: Diagnosis not present

## 2019-12-07 DIAGNOSIS — H35373 Puckering of macula, bilateral: Secondary | ICD-10-CM | POA: Diagnosis not present

## 2019-12-21 DIAGNOSIS — L570 Actinic keratosis: Secondary | ICD-10-CM | POA: Diagnosis not present

## 2019-12-21 DIAGNOSIS — D225 Melanocytic nevi of trunk: Secondary | ICD-10-CM | POA: Diagnosis not present

## 2019-12-21 DIAGNOSIS — D2271 Melanocytic nevi of right lower limb, including hip: Secondary | ICD-10-CM | POA: Diagnosis not present

## 2019-12-21 DIAGNOSIS — D2262 Melanocytic nevi of left upper limb, including shoulder: Secondary | ICD-10-CM | POA: Diagnosis not present

## 2019-12-21 DIAGNOSIS — X32XXXA Exposure to sunlight, initial encounter: Secondary | ICD-10-CM | POA: Diagnosis not present

## 2019-12-21 DIAGNOSIS — D485 Neoplasm of uncertain behavior of skin: Secondary | ICD-10-CM | POA: Diagnosis not present

## 2019-12-21 DIAGNOSIS — D2261 Melanocytic nevi of right upper limb, including shoulder: Secondary | ICD-10-CM | POA: Diagnosis not present

## 2019-12-21 DIAGNOSIS — Z85828 Personal history of other malignant neoplasm of skin: Secondary | ICD-10-CM | POA: Diagnosis not present

## 2019-12-27 DIAGNOSIS — L570 Actinic keratosis: Secondary | ICD-10-CM | POA: Diagnosis not present

## 2020-02-15 ENCOUNTER — Telehealth: Payer: Self-pay | Admitting: Family Medicine

## 2020-02-15 NOTE — Telephone Encounter (Signed)
Patient sent mychart message to schedule for Dtap vaccine and flu shot. Is it okay to schedule on nurse visit? Please advise.

## 2020-02-15 NOTE — Telephone Encounter (Signed)
Pt's last Tdap, 08/06/2014.  Not due until 07/2024.  However, he can be added to [facility] for flu shot.

## 2020-02-16 NOTE — Telephone Encounter (Signed)
Spoke with pt explaining the "Dtap vaccine needed" showing in his immunizations is a glitch in the system that he can disregard.  But I did explain his Tdap is up to date and he will be do in 07/2024.  Pt verbalizes understanding and expresses his thanks.

## 2020-02-16 NOTE — Telephone Encounter (Signed)
Patient is requesting that his record be updated to reflect his immunization as on my chart it is showing he never had it. Please advise.

## 2020-03-13 ENCOUNTER — Ambulatory Visit (INDEPENDENT_AMBULATORY_CARE_PROVIDER_SITE_OTHER): Payer: Medicare PPO

## 2020-03-13 ENCOUNTER — Other Ambulatory Visit: Payer: Self-pay

## 2020-03-13 DIAGNOSIS — Z23 Encounter for immunization: Secondary | ICD-10-CM

## 2020-04-22 DIAGNOSIS — F411 Generalized anxiety disorder: Secondary | ICD-10-CM | POA: Diagnosis not present

## 2020-04-29 ENCOUNTER — Encounter: Payer: Self-pay | Admitting: Family Medicine

## 2020-04-29 ENCOUNTER — Other Ambulatory Visit: Payer: Self-pay

## 2020-04-29 ENCOUNTER — Telehealth: Payer: Self-pay | Admitting: Family Medicine

## 2020-04-29 ENCOUNTER — Telehealth: Payer: Self-pay

## 2020-04-29 ENCOUNTER — Telehealth (INDEPENDENT_AMBULATORY_CARE_PROVIDER_SITE_OTHER): Payer: Medicare PPO | Admitting: Family Medicine

## 2020-04-29 DIAGNOSIS — R059 Cough, unspecified: Secondary | ICD-10-CM | POA: Diagnosis not present

## 2020-04-29 DIAGNOSIS — F411 Generalized anxiety disorder: Secondary | ICD-10-CM | POA: Diagnosis not present

## 2020-04-29 NOTE — Telephone Encounter (Signed)
Error

## 2020-04-29 NOTE — Telephone Encounter (Signed)
Plz schedule pt today at 12:30 MyChart video visit for cough.  Pt is not aware of appt.

## 2020-04-29 NOTE — Telephone Encounter (Signed)
Called and had to leave vm for the patient. Unable to reach will call again. EM

## 2020-04-29 NOTE — Progress Notes (Signed)
Patient ID: Harry Ray, male    DOB: May 31, 1932, 86 y.o.   MRN: [id number]  Virtual visit completed through [place], a video enabled telemedicine application. Due to national recommendations of social distancing due to COVID-19, a virtual visit is felt to be most appropriate for this patient at this time. Reviewed limitations, risks, security and privacy concerns of performing a virtual visit and the availability of in person appointments. I also reviewed that there may be a patient responsible charge related to this service. The patient agreed to proceed.   Patient location: home Provider location: [place] at [facility], office Persons participating in this virtual visit: patient, provider   If any vitals were documented, they were collected by patient at home unless specified below.    BP 131/67   Temp 97.6 F (36.4 C)   Ht 5' 9.5" (1.765 m)   Wt 165 lb (74.8 kg)   BMI 24.02 kg/m    CC: cough with phlegm Subjective:   HPI: CORY KITT is a 86 y.o. male presenting on 04/29/2020 for Cough (C/o cough with phlegm- yellow.  Started about 2 wks ago.  )   Unable to connect with patient between 12:30-1:30pm - his sound was not working and our phones were down. Will try to reconnect at 4:30pm.   Several wk h/o productive cough worse in the mornings, now persists throughout the day. Ongoing dry mouth - uses biotene at night time. Some itchy eyes - managed with allergy drops. Ongoing nasal congestion.   No fevers/chills, ear pain, ST, PNdrainage, loss of taste or smell, dyspnea or abd pain, nausea, diarrhea. No body aches. No weight loss, night sweats.  No known COVID exposure.  Some tooth pain managed with PCN - planning root canal next next month.  Uses breathe-rite strips at night time.  He had Moderna COVID booster last week.  No GERD symptoms.   Similar symptoms 10/2018 - at that time ongoing for 3-4 months, thought post infectious cough (CXR WNL) treated with flonase,  nasal saline, continued zyrtec with benefit. Hasn't tried any of this yet. H/o intermittent epistaxis.   Nocturia x1 on current regimen.      Relevant past medical, surgical, family and social history reviewed and updated as indicated. Interim medical history since our last visit reviewed. Allergies and medications reviewed and updated. Outpatient Medications Prior to Visit  Medication Sig Dispense Refill  . finasteride (PROSCAR) 5 MG tablet TAKE 1 TABLET BY MOUTH EVERY DAY 90 tablet 2  . tamsulosin (FLOMAX) 0.4 MG CAPS capsule Take 1 capsule (0.4 mg total) by mouth daily. 90 capsule 3  . cetirizine (ZYRTEC) 10 MG tablet Take 10 mg by mouth daily as needed for allergies. For seasonal allergies (Patient not taking: Reported on 04/29/2020)    . fluticasone (FLONASE) 50 MCG/ACT nasal spray Place 2 sprays into both nostrils daily as needed for allergies or rhinitis. (Patient not taking: Reported on 04/29/2020)    . mirabegron ER (MYRBETRIQ) 25 MG TB24 tablet Take 1 tablet (25 mg total) by mouth daily. 30 tablet 11   No facility-administered medications prior to visit.     Per HPI unless specifically indicated in ROS section below Review of Systems Objective:  BP 131/67   Temp 97.6 F (36.4 C)   Ht 5' 9.5" (1.765 m)   Wt 165 lb (74.8 kg)   BMI 24.02 kg/m   Wt Readings from Last 3 Encounters:  04/29/20 165 lb (74.8 kg)  10/02/19 165 lb (  74.8 kg)  09/25/19 163 lb (73.9 kg)       Physical exam: Gen: alert, NAD, not ill appearing Pulm: speaks in complete sentences without increased work of breathing Psych: normal mood, normal thought content      Assessment & Plan:   Problem List Items Addressed This Visit    Cough    Recurrence of cough for last several weeks similar to prior cough last year attributed to allergies - rec restart flonase, antihistamine, and nasal saline. No signs of bacterial infection at this time. Update if not improving with this.           No orders of  the defined types were placed in this encounter.  No orders of the defined types were placed in this encounter.   I discussed the assessment and treatment plan with the patient. The patient was provided an opportunity to ask questions and all were answered. The patient agreed with the plan and demonstrated an understanding of the instructions. The patient was advised to call back or seek an in-person evaluation if the symptoms worsen or if the condition fails to improve as anticipated.  Follow up plan: Return if symptoms worsen or fail to improve.  Ria Bush, MD

## 2020-04-29 NOTE — Telephone Encounter (Signed)
Ok to offer 12:30 virtual today?

## 2020-04-29 NOTE — Telephone Encounter (Signed)
Pt returning call.  Worked him up for today's appt.

## 2020-04-29 NOTE — Assessment & Plan Note (Signed)
Recurrence of cough for last several weeks similar to prior cough last year attributed to allergies - rec restart flonase, antihistamine, and nasal saline. No signs of bacterial infection at this time. Update if not improving with this.

## 2020-04-29 NOTE — Telephone Encounter (Signed)
Lvm on home and cell #'s asking pt to call back.  I need to get him ready for his video visit at 12:30.

## 2020-04-29 NOTE — Telephone Encounter (Signed)
Yes please

## 2020-05-07 DIAGNOSIS — F411 Generalized anxiety disorder: Secondary | ICD-10-CM | POA: Diagnosis not present

## 2020-05-15 DIAGNOSIS — F411 Generalized anxiety disorder: Secondary | ICD-10-CM | POA: Diagnosis not present

## 2020-05-21 DIAGNOSIS — H353132 Nonexudative age-related macular degeneration, bilateral, intermediate dry stage: Secondary | ICD-10-CM | POA: Diagnosis not present

## 2020-06-10 DIAGNOSIS — L820 Inflamed seborrheic keratosis: Secondary | ICD-10-CM | POA: Diagnosis not present

## 2020-06-10 DIAGNOSIS — L538 Other specified erythematous conditions: Secondary | ICD-10-CM | POA: Diagnosis not present

## 2020-06-10 DIAGNOSIS — R208 Other disturbances of skin sensation: Secondary | ICD-10-CM | POA: Diagnosis not present

## 2020-06-20 ENCOUNTER — Encounter: Payer: Self-pay | Admitting: Family Medicine

## 2020-07-30 DIAGNOSIS — F411 Generalized anxiety disorder: Secondary | ICD-10-CM | POA: Diagnosis not present

## 2020-08-13 DIAGNOSIS — F411 Generalized anxiety disorder: Secondary | ICD-10-CM | POA: Diagnosis not present

## 2020-08-15 ENCOUNTER — Other Ambulatory Visit: Payer: Self-pay | Admitting: Family Medicine

## 2020-09-16 ENCOUNTER — Encounter: Payer: Self-pay | Admitting: Family Medicine

## 2020-09-16 NOTE — Telephone Encounter (Addendum)
Ok to schedule tomorrow at 12:30pm.

## 2020-09-16 NOTE — Telephone Encounter (Signed)
Spoke with pt asking if any SOB, fever, body aches.  Pt denies any other sxs.    Ok to add pt on Wed, 4/13 at 12:30?

## 2020-09-17 NOTE — Telephone Encounter (Signed)
Pt schedule tomorrow at 8:00.

## 2020-09-17 NOTE — Telephone Encounter (Signed)
Per Dr. Darnell Level, ok to schedule OV.

## 2020-09-17 NOTE — Telephone Encounter (Signed)
There is a cancellation at 8:00 tomorrow.    Lvm asking pt to call back.  See if pt can come in at 8:00 tomorrow vs 12:30.  If so, plz schedule.

## 2020-09-18 ENCOUNTER — Encounter: Payer: Self-pay | Admitting: Family Medicine

## 2020-09-18 ENCOUNTER — Ambulatory Visit (INDEPENDENT_AMBULATORY_CARE_PROVIDER_SITE_OTHER): Payer: Medicare PPO | Admitting: Family Medicine

## 2020-09-18 ENCOUNTER — Other Ambulatory Visit: Payer: Self-pay

## 2020-09-18 ENCOUNTER — Ambulatory Visit (INDEPENDENT_AMBULATORY_CARE_PROVIDER_SITE_OTHER)
Admission: RE | Admit: 2020-09-18 | Discharge: 2020-09-18 | Disposition: A | Discharge status: Home / Self Care | Payer: Medicare PPO | Source: Ambulatory Visit | Attending: Family Medicine | Admitting: Family Medicine

## 2020-09-18 VITALS — BP 118/72 | HR 77 | Temp 97.6°F | Ht 69.5 in | Wt 176.3 lb

## 2020-09-18 DIAGNOSIS — R062 Wheezing: Secondary | ICD-10-CM

## 2020-09-18 DIAGNOSIS — R011 Cardiac murmur, unspecified: Secondary | ICD-10-CM

## 2020-09-18 DIAGNOSIS — L729 Follicular cyst of the skin and subcutaneous tissue, unspecified: Secondary | ICD-10-CM | POA: Diagnosis not present

## 2020-09-18 DIAGNOSIS — L723 Sebaceous cyst: Secondary | ICD-10-CM | POA: Diagnosis not present

## 2020-09-18 MED ORDER — FLUTICASONE PROPIONATE 50 MCG/ACT NA SUSP: 2.0000 | Freq: Every day | NASAL | 6 refills | Status: DC | PRN

## 2020-09-18 NOTE — Patient Instructions (Addendum)
For wheezing - check chest xray today. Start plain mucinex with plenty of water to break up possible mucous that's contributing to symptoms. Also continue flonase and zyrtec to help with possible allergic component.  Bump on scrotum looks stable.

## 2020-09-18 NOTE — Assessment & Plan Note (Signed)
Mild, heard again today. Will continue to monitor.

## 2020-09-18 NOTE — Progress Notes (Signed)
Patient ID: Harry Ray, male    DOB: May 01, 1932, 87 y.o.   MRN: [id number]  This visit was conducted in person.  BP 118/72   Pulse 77   Temp 97.6 F (36.4 C) (Temporal)   Ht 5' 9.5" (1.765 m)   Wt 176 lb 5 oz (80 kg)   SpO2 99%   BMI 25.66 kg/m    CC: discuss wheezing, scrotal bump  Subjective:   HPI: Harry Ray is a 87 y.o. male presenting on 09/18/2020 for Wheezing (C/o wheezing.  Started about 1 mo ago. ) and Cyst (C/o cyst on right scrotum.  Noticed about 2 yrs ago.  Thinks it may have increased in size. )   About 1 month history of wheezing sound noted predominantly at night when sleeping on left side. Now noticing wheezing sound throughout the day. Mild cough productive of phlegm a few times during the day. No fevers/chills, dyspnea or chest tightness. No h/o COPD or smoking history.  Tends to get seasonal allergies - itchy watery eyes, runny nose. Takes zyrtec and flonase.  Episode 1983 of asthma attack thought related to allergy medication (?albuterol) s/p ICU stay.   Scrotal bump previously thought to be small blackhead cyst to R lateral scrotal wall. Present for years. May be enlarging. Requests re evaluated.   Has decreased exercise routine. Still walking about 1-2 miles a few times a week. Has just started gardening at son's farm.   Overall doing well with finasteride and tamsulosin. Nocturia x0-1, improved urgency.      Relevant past medical, surgical, family and social history reviewed and updated as indicated. Interim medical history since our last visit reviewed. Allergies and medications reviewed and updated. Outpatient Medications Prior to Visit  Medication Sig Dispense Refill  . cetirizine (ZYRTEC) 10 MG tablet Take 10 mg by mouth daily as needed for allergies. For seasonal allergies    . finasteride (PROSCAR) 5 MG tablet TAKE 1 TABLET BY MOUTH EVERY DAY 90 tablet 0  . Polyethyl Glycol-Propyl Glycol (SYSTANE OP) Apply to eye.    . tamsulosin  (FLOMAX) 0.4 MG CAPS capsule Take 1 capsule (0.4 mg total) by mouth daily. 90 capsule 3  . fluticasone (FLONASE) 50 MCG/ACT nasal spray Place 2 sprays into both nostrils daily as needed for allergies or rhinitis.     No facility-administered medications prior to visit.     Per HPI unless specifically indicated in ROS section below Review of Systems Objective:  BP 118/72   Pulse 77   Temp 97.6 F (36.4 C) (Temporal)   Ht 5' 9.5" (1.765 m)   Wt 176 lb 5 oz (80 kg)   SpO2 99%   BMI 25.66 kg/m   Wt Readings from Last 3 Encounters:  09/18/20 176 lb 5 oz (80 kg)  04/29/20 165 lb (74.8 kg)  10/02/19 165 lb (74.8 kg)      Physical Exam Constitutional:      Appearance: Normal appearance. He is not ill-appearing.  Cardiovascular:     Rate and Rhythm: Normal rate and regular rhythm.     Pulses: Normal pulses.     Heart sounds: Murmur (2/6 systolic throuhgout) heard.    Pulmonary:     Effort: Pulmonary effort is normal. No respiratory distress.     Breath sounds: Wheezing (faint insp/exp) present. No rhonchi or rales.     Comments: Coarse breath sounds throughout Genitourinary:    Comments: R posterior scrotal wall with small blackhead cyst 25mm diameter without  surrounding erythema or drainage Musculoskeletal:     Right lower leg: Edema (tr) present.     Left lower leg: Edema (tr) present.  Neurological:     Mental Status: He is alert.  Psychiatric:        Mood and Affect: Mood normal.        Behavior: Behavior normal.       Results for orders placed or performed in visit on 09/25/19  PSA  Result Value Ref Range   PSA 0.44 0.10 - 4.00 ng/mL  TSH  Result Value Ref Range   TSH 1.49 0.35 - 4.50 uIU/mL  Comprehensive metabolic panel  Result Value Ref Range   Sodium 139 135 - 145 mEq/L   Potassium 4.2 3.5 - 5.1 mEq/L   Chloride 103 96 - 112 mEq/L   CO2 29 19 - 32 mEq/L   Glucose, Bld 87 70 - 99 mg/dL   BUN 16 6 - 23 mg/dL   Creatinine, Ser 0.94 0.40 - 1.50 mg/dL    Total Bilirubin 0.7 0.2 - 1.2 mg/dL   Alkaline Phosphatase 65 39 - 117 U/L   AST 18 0 - 37 U/L   ALT 11 0 - 53 U/L   Total Protein 6.9 6.0 - 8.3 g/dL   Albumin 4.4 3.5 - 5.2 g/dL   GFR 76.08 >60.00 mL/min   Calcium 9.2 8.4 - 10.5 mg/dL  Lipid panel  Result Value Ref Range   Cholesterol 170 0 - 200 mg/dL   Triglycerides 65.0 0.0 - 149.0 mg/dL   HDL 53.60 >39.00 mg/dL   VLDL 13.0 0.0 - 40.0 mg/dL   LDL Cholesterol 103 (H) 0 - 99 mg/dL   Total CHOL/HDL Ratio 3    NonHDL 116.07    Assessment & Plan:  This visit occurred during the SARS-CoV-2 public health emergency.  Safety protocols were in place, including screening questions prior to the visit, additional usage of staff PPE, and extensive cleaning of exam room while observing appropriate contact time as indicated for disinfecting solutions.   Problem List Items Addressed This Visit    Systolic murmur    Mild, heard again today. Will continue to monitor.       Wheezing - Primary    Exam with mild wheezing and coarse breath sounds throughout, not localizing to one area of lungs. Will check baseline CXR today. Pt overall asxs. Will trial plain mucinex. Also continue flonase and zyrtec for possible allergic component.      Relevant Orders   DG Chest 2 View   Scrotal cyst    Measured, 45mm today. Overall asxs. Will continue to monitor.      RESOLVED: Scrotal sebaceous cyst       Meds ordered this encounter  Medications  . fluticasone (FLONASE) 50 MCG/ACT nasal spray    Sig: Place 2 sprays into both nostrils daily as needed for allergies or rhinitis.    Dispense:  16 g    Refill:  6   Orders Placed This Encounter  Procedures  . DG Chest 2 View    Standing Status:   Future    Number of Occurrences:   1    Standing Expiration Date:   09/18/2021    Order Specific Question:   Reason for Exam (SYMPTOM  OR DIAGNOSIS REQUIRED)    Answer:   wheezing    Order Specific Question:   Preferred imaging location?    Answer:   [facility]    Patient Instructions  For  wheezing - check chest xray today. Start plain mucinex with plenty of water to break up possible mucous that's contributing to symptoms. Also continue flonase and zyrtec to help with possible allergic component.  Bump on scrotum looks stable.   Follow up plan: Return if symptoms worsen or fail to improve.  Ria Bush, MD

## 2020-09-18 NOTE — Assessment & Plan Note (Signed)
Measured, 8mm today. Overall asxs. Will continue to monitor.

## 2020-09-18 NOTE — Assessment & Plan Note (Addendum)
Exam with mild wheezing and coarse breath sounds throughout, not localizing to one area of lungs. Will check baseline CXR today. Pt overall asxs. Will trial plain mucinex. Also continue flonase and zyrtec for possible allergic component.

## 2020-09-23 ENCOUNTER — Encounter: Payer: Self-pay | Admitting: Family Medicine

## 2020-09-23 DIAGNOSIS — S22000A Wedge compression fracture of unspecified thoracic vertebra, initial encounter for closed fracture: Secondary | ICD-10-CM | POA: Insufficient documentation

## 2020-09-25 ENCOUNTER — Ambulatory Visit (INDEPENDENT_AMBULATORY_CARE_PROVIDER_SITE_OTHER): Payer: Medicare PPO

## 2020-09-25 ENCOUNTER — Other Ambulatory Visit: Payer: Self-pay

## 2020-09-25 DIAGNOSIS — Z0000 Encounter for general adult medical examination without abnormal findings: Secondary | ICD-10-CM

## 2020-09-25 NOTE — Progress Notes (Signed)
PCP notes:  Health Maintenance: No gaps noted   Abnormal Screenings: none   Patient concerns: Wants to have bone density test scheduled   Nurse concerns: none   Next PCP appt.: 11/22/2020 @ 2 pm

## 2020-09-25 NOTE — Progress Notes (Signed)
Subjective:   Harry Ray is a 87 y.o. male who presents for Medicare Annual/Subsequent preventive examination.  Review of Systems: N/A      I connected with the patient today by telephone and verified that I am speaking with the correct person using two identifiers. Location patient: home Location nurse: work Persons participating in the telephone visit: patient, nurse.   I discussed the limitations, risks, security and privacy concerns of performing an evaluation and management service by telephone and the availability of in person appointments. I also discussed with the patient that there may be a patient responsible charge related to this service. The patient expressed understanding and verbally consented to this telephonic visit.        Cardiac Risk Factors include: advanced age (>87men, >7 women);male gender;Other (see comment), Risk factor comments: hyperlipidemia     Objective:    Today's Vitals   There is no height or weight on file to calculate BMI.  Advanced Directives 09/25/2020 09/25/2019 09/23/2018 09/09/2017  Does Patient Have a Medical Advance Directive? Yes Yes Yes Yes  Type of Paramedic of [place];Living will [place];Living will [place];Living will [place];Living will  Copy of [place] in Chart? No - copy requested Yes - validated most recent copy scanned in chart (See row information) Yes - validated most recent copy scanned in chart (See row information) Yes  Would patient like information on creating a medical advance directive? - - No - Patient declined -    Current Medications (verified) Outpatient Encounter Medications as of 09/25/2020  Medication Sig  . cetirizine (ZYRTEC) 10 MG tablet Take 10 mg by mouth daily as needed for allergies. For seasonal allergies  . finasteride (PROSCAR) 5 MG tablet TAKE 1 TABLET BY MOUTH EVERY DAY  . fluticasone  (FLONASE) 50 MCG/ACT nasal spray Place 2 sprays into both nostrils daily as needed for allergies or rhinitis.  Vladimir Faster Glycol-Propyl Glycol (SYSTANE OP) Apply to eye.  . tamsulosin (FLOMAX) 0.4 MG CAPS capsule Take 1 capsule (0.4 mg total) by mouth daily.   No facility-administered encounter medications on file as of 09/25/2020.    Allergies (verified) Patient has no known allergies.   History: Past Medical History:  Diagnosis Date  . Arthritis    pinkys bilaterally and left thumb  . Carotid stenosis 08/08/4399   LICA 02-72%, RICA 5-36%, rpt 1 yr (09/2016)  . Heart murmur    minor  . History of asthma 1983   asthma attack as reaction to allergy medication (2d ICU stay)  . History of chicken pox   . Nephritis childhood  . Seasonal allergies    spring  . Urine incontinence    Past Surgical History:  Procedure Laterality Date  . COLONOSCOPY  08/07/2005   Diverticulosis (Colon in [place])  . SCROTAL SURGERY     ? benign cyst removal   Family History  Problem Relation Age of Onset  . Dementia Mother        Pick's disease  . Cancer Maternal Grandfather        stomach  . Cancer Paternal Uncle        throat - smoker  . Diabetes Neg Hx   . CAD Neg Hx    Social History   Socioeconomic History  . Marital status: Widowed    Spouse name: Not on file  . Number of children: Not on file  . Years of education: Not  on file  . Highest education level: Not on file  Occupational History  . Not on file  Tobacco Use  . Smoking status: Never Smoker  . Smokeless tobacco: Never Used  Vaping Use  . Vaping Use: Never used  Substance and Sexual Activity  . Alcohol use: No    Alcohol/week: 0.0 standard drinks  . Drug use: No  . Sexual activity: Not on file  Other Topics Concern  . Not on file  Social History Narrative   Widower, lives at [facility], no pets    Was married for 27 yrs    Wife suddenly passed away during a surgery 03-27-19    Son and family live in [place]: retired, was [employer]   Activity: starting [place] exercise program 2x/wk, enjoys walking   Social Determinants of Health   Financial Resource Strain: Low Risk   . Difficulty of Paying Living Expenses: Not hard at all  Food Insecurity: No Food Insecurity  . Worried About Charity fundraiser in the Last Year: Never true  . Ran Out of Food in the Last Year: Never true  Transportation Needs: No Transportation Needs  . Lack of Transportation (Medical): No  . Lack of Transportation (Non-Medical): No  Physical Activity: Insufficiently Active  . Days of Exercise per Week: 3 days  . Minutes of Exercise per Session: 30 min  Stress: No Stress Concern Present  . Feeling of Stress : Not at all  Social Connections: Not on file    Tobacco Counseling Counseling given: Not Answered   Clinical Intake:  Pre-visit preparation completed: Yes  Pain : No/denies pain     Nutritional Risks: None Diabetes: No  How often do you need to have someone help you when you read instructions, pamphlets, or other written materials from your doctor or pharmacy?: 1 - Never  Diabetic: No Nutrition Risk Assessment:  Has the patient had any N/V/D within the last 2 months?  No  Does the patient have any non-healing wounds?  No  Has the patient had any unintentional weight loss or weight gain?  No   Diabetes:  Is the patient diabetic?  No  If diabetic, was a CBG obtained today?  N/A Did the patient bring in their glucometer from home?  N/A How often do you monitor your CBG's? N/A.   Financial Strains and Diabetes Management:  Are you having any financial strains with the device, your supplies or your medication? N/A.  Does the patient want to be seen by Chronic Care Management for management of their diabetes?  N/A Would the patient like to be referred to a Nutritionist or for Diabetic Management?  N/A   Interpreter Needed?: No  Information entered by :: CJohnson,  LPN   Activities of Daily Living In your present state of health, do you have any difficulty performing the following activities: 09/25/2020  Hearing? N  Vision? N  Difficulty concentrating or making decisions? N  Walking or climbing stairs? N  Dressing or bathing? N  Doing errands, shopping? N  Preparing Food and eating ? N  Using the Toilet? N  In the past six months, have you accidently leaked urine? Y  Comment takes medication  Do you have problems with loss of bowel control? N  Managing your Medications? N  Managing your Finances? N  Housekeeping or managing your Housekeeping? N  Some recent data might be hidden    Patient Care Team: Ria Bush, MD  as PCP - General (Family Medicine)  Indicate any recent Medical Services you may have received from other than [facility] providers in the past year (date may be approximate).     Assessment:   This is a routine wellness examination for [place].  Hearing/Vision screen  Hearing Screening   125Hz  250Hz  500Hz  1000Hz  2000Hz  3000Hz  4000Hz  6000Hz  8000Hz   Right ear:           Left ear:           Vision Screening Comments: Patient gets annual eye exams   Dietary issues and exercise activities discussed: Current Exercise Habits: Home exercise routine, Type of exercise: walking, Time (Minutes): 30, Frequency (Times/Week): 3, Weekly Exercise (Minutes/Week): 90, Intensity: Moderate, Exercise limited by: None identified  Goals    . Patient Stated     Starting 09/23/18, I will continue take medications as prescribed.     . Patient Stated     09/25/2019, I will continue walking everyday for about 2 miles.    . Patient Stated     09/25/2020, I will continue to walk 2-3 days a week for 1 mile.       Depression Screen PHQ 2/9 Scores 09/25/2020 09/25/2019 09/23/2018 09/09/2017 08/25/2016  PHQ - 2 Score 2 2 0 0 0  PHQ- 9 Score 2 2 0 0 -    Fall Risk Fall Risk  09/25/2020 09/25/2019 09/23/2018 09/09/2017 08/25/2016  Falls in the past year? 0 0  0 No No  Number falls in past yr: 0 0 - - -  Injury with Fall? 0 0 - - -  Risk for fall due to : Impaired balance/gait;Medication side effect No Fall Risks - - -  Follow up Falls evaluation completed;Falls prevention discussed Falls evaluation completed;Falls prevention discussed - - -    FALL RISK PREVENTION PERTAINING TO THE HOME:  Any stairs in or around the home? Yes  If so, are there any without handrails? No  Home free of loose throw rugs in walkways, pet beds, electrical cords, etc? Yes  Adequate lighting in your home to reduce risk of falls? Yes   ASSISTIVE DEVICES UTILIZED TO PREVENT FALLS:  Life alert? No  Use of a cane, walker or w/c? No  Grab bars in the bathroom? Yes  Shower chair or bench in shower? No  Elevated toilet seat or a handicapped toilet? No   TIMED UP AND GO:  Was the test performed? N/A telephone visit .   Cognitive Function: MMSE - Mini Mental State Exam 09/25/2020 09/25/2019 09/23/2018 09/09/2017  Orientation to time 5 5 5 5   Orientation to Place 5 5 5 5   Registration 3 3 3 3   Attention/ Calculation 5 5 0 0  Recall 3 3 3 3   Language- name 2 objects - - 0 0  Language- repeat 1 1 1 1   Language- follow 3 step command - - 0 3  Language- read & follow direction - - 0 0  Write a sentence - - 0 0  Copy design - - 0 0  Total score - - 17 20  Mini Cog  Mini-Cog screen was completed. Maximum score is 22. A value of 0 denotes this part of the MMSE was not completed or the patient failed this part of the Mini-Cog screening.       Immunizations Immunization History  Administered Date(s) Administered  . Fluad Quad(high Dose 65+) 03/28/2019, 03/13/2020  . H1N1 05/19/2008  . Influenza Inj Mdck Quad Pf 04/29/2015  . Influenza  Split 03/06/2008, 03/27/2011, 04/28/2012, 04/05/2013, 03/21/2014  . Influenza,inj,Quad PF,6+ Mos 02/18/2016, 03/03/2017, 03/17/2018  . Influenza-Unspecified 04/29/2015  . Moderna SARS-COV2 Booster Vaccination 04/23/2020  . Moderna  Sars-Covid-2 Vaccination 06/26/2019, 07/24/2019  . Pneumococcal Conjugate-13 07/17/2014  . Pneumococcal Polysaccharide-23 04/09/2010  . Tdap 08/06/2014  . Zoster 05/15/2010    TDAP status: Up to date  Flu Vaccine status: Up to date  Pneumococcal vaccine status: Up to date  Covid-19 vaccine status: Completed vaccines  Qualifies for Shingles Vaccine? Yes   Zostavax completed Yes   Shingrix Completed?: No.    Education has been provided regarding the importance of this vaccine. Patient has been advised to call insurance company to determine out of pocket expense if they have not yet received this vaccine. Advised may also receive vaccine at local pharmacy or Health Dept. Verbalized acceptance and understanding.  Screening Tests Health Maintenance  Topic Date Due  . INFLUENZA VACCINE  01/06/2021  . TETANUS/TDAP  08/05/2024  . COVID-19 Vaccine  Completed  . PNA vac Low Risk Adult  Completed  . HPV VACCINES  Aged Out    Health Maintenance  There are no preventive care reminders to display for this patient.  Colorectal cancer screening: No longer required.   Lung Cancer Screening: (Low Dose CT Chest recommended if Age 32-80 years, 30 pack-year currently smoking OR have quit w/in 15 years) does not qualify.    Additional Screening:  Hepatitis C Screening: does not qualify; Completed N/A  Vision Screening: Recommended annual ophthalmology exams for early detection of glaucoma and other disorders of the eye. Is the patient up to date with their annual eye exam?  Yes  Who is the provider or what is the name of the office in which the patient attends annual eye exams? Dr. Wallace Going  If pt is not established with a provider, would they like to be referred to a provider to establish care? No .   Dental Screening: Recommended annual dental exams for proper oral hygiene  Community Resource Referral / Chronic Care Management: CRR required this visit?  No   CCM required this visit?   No      Plan:     I have personally reviewed and noted the following in the patient's chart:   . Medical and social history . Use of alcohol, tobacco or illicit drugs  . Current medications and supplements . Functional ability and status . Nutritional status . Physical activity . Advanced directives . List of other physicians . Hospitalizations, surgeries, and ER visits in previous 12 months . Vitals . Screenings to include cognitive, depression, and falls . Referrals and appointments  In addition, I have reviewed and discussed with patient certain preventive protocols, quality metrics, and best practice recommendations. A written personalized care plan for preventive services as well as general preventive health recommendations were provided to patient.   Due to this being a telephonic visit, the after visit summary with patients personalized plan was offered to patient via office or my-chart. Patient preferred to pick up at office at next visit or via mychart.   Andrez Grime, LPN   11/10/5407

## 2020-09-25 NOTE — Patient Instructions (Signed)
Harry Ray , Thank you for taking time to come for your Medicare Wellness Visit. I appreciate your ongoing commitment to your health goals. Please review the following plan we discussed and let me know if I can assist you in the future.   Screening recommendations/referrals: Colonoscopy: no longer required  Recommended yearly ophthalmology/optometry visit for glaucoma screening and checkup Recommended yearly dental visit for hygiene and checkup  Vaccinations: Influenza vaccine: Up to date, completed 03/13/2020, due 01/2021 Pneumococcal vaccine: Completed series Tdap vaccine: Up to date, completed 08/06/2014, due 07/2024 Shingles vaccine: due, check with your insurance regarding coverage if interested    Covid-19: Completed series  Advanced directives: Please bring a copy of your POA (Power of [place]) and/or Living Will to your next appointment.   Conditions/risks identified: hyperlipidemia  Next appointment: Follow up in one year for your annual wellness visit.   Preventive Care 85 Years and Older, Male Preventive care refers to lifestyle choices and visits with your health care provider that can promote health and wellness. What does preventive care include?  A yearly physical exam. This is also called an annual well check.  Dental exams once or twice a year.  Routine eye exams. Ask your health care provider how often you should have your eyes checked.  Personal lifestyle choices, including:  Daily care of your teeth and gums.  Regular physical activity.  Eating a healthy diet.  Avoiding tobacco and drug use.  Limiting alcohol use.  Practicing safe sex.  Taking low doses of aspirin every day.  Taking vitamin and mineral supplements as recommended by your health care provider. What happens during an annual well check? The services and screenings done by your health care provider during your annual well check will depend on your age, overall health, lifestyle risk  factors, and family history of disease. Counseling  Your health care provider may ask you questions about your:  Alcohol use.  Tobacco use.  Drug use.  Emotional well-being.  Home and relationship well-being.  Sexual activity.  Eating habits.  History of falls.  Memory and ability to understand (cognition).  Work and work Statistician. Screening  You may have the following tests or measurements:  Height, weight, and BMI.  Blood pressure.  Lipid and cholesterol levels. These may be checked every 5 years, or more frequently if you are over 85 years old.  Skin check.  Lung cancer screening. You may have this screening every year starting at age 85 if you have a 30-pack-year history of smoking and currently smoke or have quit within the past 15 years.  Fecal occult blood test (FOBT) of the stool. You may have this test every year starting at age 85  Flexible sigmoidoscopy or colonoscopy. You may have a sigmoidoscopy every 5 years or a colonoscopy every 10 years starting at age 85.  Prostate cancer screening. Recommendations will vary depending on your family history and other risks.  Hepatitis C blood test.  Hepatitis B blood test.  Sexually transmitted disease (STD) testing.  Diabetes screening. This is done by checking your blood sugar (glucose) after you have not eaten for a while (fasting). You may have this done every 1-3 years.  Abdominal aortic aneurysm (AAA) screening. You may need this if you are a current or former smoker.  Osteoporosis. You may be screened starting at age 43 if you are at high risk. Talk with your health care provider about your test results, treatment options, and if necessary, the need for more tests. Vaccines  Your health care provider may recommend certain vaccines, such as:  Influenza vaccine. This is recommended every year.  Tetanus, diphtheria, and acellular pertussis (Tdap, Td) vaccine. You may need a Td booster every 10  years.  Zoster vaccine. You may need this after age 65.  Pneumococcal 13-valent conjugate (PCV13) vaccine. One dose is recommended after age 14.  Pneumococcal polysaccharide (PPSV23) vaccine. One dose is recommended after age 45. Talk to your health care provider about which screenings and vaccines you need and how often you need them. This information is not intended to replace advice given to you by your health care provider. Make sure you discuss any questions you have with your health care provider. Document Released: 06/21/2015 Document Revised: 02/12/2016 Document Reviewed: 03/26/2015 Elsevier Interactive Patient Education  2017 [place] Prevention in the Home Falls can cause injuries. They can happen to people of all ages. There are many things you can do to make your home safe and to help prevent falls. What can I do on the outside of my home?  Regularly fix the edges of walkways and driveways and fix any cracks.  Remove anything that might make you trip as you walk through a door, such as a raised step or threshold.  Trim any bushes or trees on the path to your home.  Use bright outdoor lighting.  Clear any walking paths of anything that might make someone trip, such as rocks or tools.  Regularly check to see if handrails are loose or broken. Make sure that both sides of any steps have handrails.  Any raised decks and porches should have guardrails on the edges.  Have any leaves, snow, or ice cleared regularly.  Use sand or salt on walking paths during winter.  Clean up any spills in your garage right away. This includes oil or grease spills. What can I do in the bathroom?  Use night lights.  Install grab bars by the toilet and in the tub and shower. Do not use towel bars as grab bars.  Use non-skid mats or decals in the tub or shower.  If you need to sit down in the shower, use a plastic, non-slip stool.  Keep the floor dry. Clean up any water that  spills on the floor as soon as it happens.  Remove soap buildup in the tub or shower regularly.  Attach bath mats securely with double-sided non-slip rug tape.  Do not have throw rugs and other things on the floor that can make you trip. What can I do in the bedroom?  Use night lights.  Make sure that you have a light by your bed that is easy to reach.  Do not use any sheets or blankets that are too big for your bed. They should not hang down onto the floor.  Have a firm chair that has side arms. You can use this for support while you get dressed.  Do not have throw rugs and other things on the floor that can make you trip. What can I do in the kitchen?  Clean up any spills right away.  Avoid walking on wet floors.  Keep items that you use a lot in easy-to-reach places.  If you need to reach something above you, use a strong step stool that has a grab bar.  Keep electrical cords out of the way.  Do not use floor polish or wax that makes floors slippery. If you must use wax, use non-skid floor wax.  Do  not have throw rugs and other things on the floor that can make you trip. What can I do with my stairs?  Do not leave any items on the stairs.  Make sure that there are handrails on both sides of the stairs and use them. Fix handrails that are broken or loose. Make sure that handrails are as long as the stairways.  Check any carpeting to make sure that it is firmly attached to the stairs. Fix any carpet that is loose or worn.  Avoid having throw rugs at the top or bottom of the stairs. If you do have throw rugs, attach them to the floor with carpet tape.  Make sure that you have a light switch at the top of the stairs and the bottom of the stairs. If you do not have them, ask someone to add them for you. What else can I do to help prevent falls?  Wear shoes that:  Do not have high heels.  Have rubber bottoms.  Are comfortable and fit you well.  Are closed at the  toe. Do not wear sandals.  If you use a stepladder:  Make sure that it is fully opened. Do not climb a closed stepladder.  Make sure that both sides of the stepladder are locked into place.  Ask someone to hold it for you, if possible.  Clearly mark and make sure that you can see:  Any grab bars or handrails.  First and last steps.  Where the edge of each step is.  Use tools that help you move around (mobility aids) if they are needed. These include:  Canes.  Walkers.  Scooters.  Crutches.  Turn on the lights when you go into a dark area. Replace any light bulbs as soon as they burn out.  Set up your furniture so you have a clear path. Avoid moving your furniture around.  If any of your floors are uneven, fix them.  If there are any pets around you, be aware of where they are.  Review your medicines with your doctor. Some medicines can make you feel dizzy. This can increase your chance of falling. Ask your doctor what other things that you can do to help prevent falls. This information is not intended to replace advice given to you by your health care provider. Make sure you discuss any questions you have with your health care provider. Document Released: 03/21/2009 Document Revised: 10/31/2015 Document Reviewed: 06/29/2014 Elsevier Interactive Patient Education  2017 [employer].

## 2020-10-01 ENCOUNTER — Other Ambulatory Visit: Payer: Self-pay | Admitting: Family Medicine

## 2020-11-10 ENCOUNTER — Other Ambulatory Visit: Payer: Self-pay | Admitting: Family Medicine

## 2020-11-14 ENCOUNTER — Other Ambulatory Visit: Payer: Self-pay | Admitting: Family Medicine

## 2020-11-14 DIAGNOSIS — E041 Nontoxic single thyroid nodule: Secondary | ICD-10-CM

## 2020-11-14 DIAGNOSIS — E785 Hyperlipidemia, unspecified: Secondary | ICD-10-CM

## 2020-11-14 DIAGNOSIS — R3914 Feeling of incomplete bladder emptying: Secondary | ICD-10-CM

## 2020-11-15 ENCOUNTER — Other Ambulatory Visit: Payer: Self-pay

## 2020-11-15 ENCOUNTER — Other Ambulatory Visit (INDEPENDENT_AMBULATORY_CARE_PROVIDER_SITE_OTHER): Payer: Medicare PPO

## 2020-11-15 DIAGNOSIS — N401 Enlarged prostate with lower urinary tract symptoms: Secondary | ICD-10-CM | POA: Diagnosis not present

## 2020-11-15 DIAGNOSIS — E041 Nontoxic single thyroid nodule: Secondary | ICD-10-CM | POA: Diagnosis not present

## 2020-11-15 DIAGNOSIS — R3914 Feeling of incomplete bladder emptying: Secondary | ICD-10-CM

## 2020-11-15 DIAGNOSIS — E785 Hyperlipidemia, unspecified: Secondary | ICD-10-CM

## 2020-11-15 LAB — COMPREHENSIVE METABOLIC PANEL
ALT: 14 U/L (ref 0–53)
AST: 17 U/L (ref 0–37)
Albumin: 4.3 g/dL (ref 3.5–5.2)
Alkaline Phosphatase: 66 U/L (ref 39–117)
BUN: 18 mg/dL (ref 6–23)
CO2: 28 mEq/L (ref 19–32)
Calcium: 9.1 mg/dL (ref 8.4–10.5)
Chloride: 104 mEq/L (ref 96–112)
Creatinine, Ser: 1 mg/dL (ref 0.40–1.50)
GFR: 67.81 mL/min (ref >60.00)
Glucose, Bld: 89 mg/dL (ref 70–99)
Potassium: 4.5 mEq/L (ref 3.5–5.1)
Sodium: 140 mEq/L (ref 135–145)
Total Bilirubin: 0.5 mg/dL (ref 0.2–1.2)
Total Protein: 6.9 g/dL (ref 6.0–8.3)

## 2020-11-15 LAB — LIPID PANEL
Cholesterol: 196 mg/dL (ref 0–200)
HDL: 49.1 mg/dL (ref >39.00)
LDL Cholesterol: 123 mg/dL — ABNORMAL HIGH (ref 0–99)
NonHDL: 146.99
Total CHOL/HDL Ratio: 4
Triglycerides: 119 mg/dL (ref 0.0–149.0)
VLDL: 23.8 mg/dL (ref 0.0–40.0)

## 2020-11-15 LAB — TSH: TSH: 1.55 u[IU]/mL (ref 0.35–4.50)

## 2020-11-15 LAB — PSA: PSA: 0.33 ng/mL (ref 0.10–4.00)

## 2020-11-21 ENCOUNTER — Encounter: Payer: Self-pay | Admitting: Family Medicine

## 2020-11-22 ENCOUNTER — Encounter: Payer: Self-pay | Admitting: Family Medicine

## 2020-11-22 ENCOUNTER — Ambulatory Visit (INDEPENDENT_AMBULATORY_CARE_PROVIDER_SITE_OTHER): Payer: Medicare PPO | Admitting: Family Medicine

## 2020-11-22 ENCOUNTER — Other Ambulatory Visit: Payer: Self-pay

## 2020-11-22 VITALS — BP 124/80 | HR 84 | Temp 97.5°F | Ht 69.5 in | Wt 172.1 lb

## 2020-11-22 DIAGNOSIS — R0609 Other forms of dyspnea: Secondary | ICD-10-CM | POA: Insufficient documentation

## 2020-11-22 DIAGNOSIS — R3914 Feeling of incomplete bladder emptying: Secondary | ICD-10-CM

## 2020-11-22 DIAGNOSIS — R059 Cough, unspecified: Secondary | ICD-10-CM

## 2020-11-22 DIAGNOSIS — E041 Nontoxic single thyroid nodule: Secondary | ICD-10-CM | POA: Diagnosis not present

## 2020-11-22 DIAGNOSIS — I6529 Occlusion and stenosis of unspecified carotid artery: Secondary | ICD-10-CM

## 2020-11-22 DIAGNOSIS — R0600 Dyspnea, unspecified: Secondary | ICD-10-CM | POA: Diagnosis not present

## 2020-11-22 DIAGNOSIS — N3941 Urge incontinence: Secondary | ICD-10-CM | POA: Diagnosis not present

## 2020-11-22 DIAGNOSIS — N401 Enlarged prostate with lower urinary tract symptoms: Secondary | ICD-10-CM | POA: Diagnosis not present

## 2020-11-22 DIAGNOSIS — Z0000 Encounter for general adult medical examination without abnormal findings: Secondary | ICD-10-CM

## 2020-11-22 DIAGNOSIS — E785 Hyperlipidemia, unspecified: Secondary | ICD-10-CM | POA: Diagnosis not present

## 2020-11-22 DIAGNOSIS — S22000A Wedge compression fracture of unspecified thoracic vertebra, initial encounter for closed fracture: Secondary | ICD-10-CM

## 2020-11-22 LAB — POC URINALSYSI DIPSTICK (AUTOMATED)
Bilirubin, UA: NEGATIVE
Blood, UA: NEGATIVE
Glucose, UA: NEGATIVE
Ketones, UA: NEGATIVE
Leukocytes, UA: NEGATIVE
Nitrite, UA: NEGATIVE
Protein, UA: NEGATIVE
Spec Grav, UA: 1.015
Urobilinogen, UA: 0.2 U/dL
pH, UA: 6

## 2020-11-22 NOTE — Assessment & Plan Note (Signed)
Ongoing, mild.  CXR from 09/2020 reassuring.  Will continue to monitor.  Discussed water and mucinex use.

## 2020-11-22 NOTE — Patient Instructions (Addendum)
If interested, check with pharmacy about new 2 shot shingles series (shingrix).  EKG today  Let us know if you develop exertional chest pressure/tightness.  Good to see you today Return as needed or in 6 months for follow up visit.   Health Maintenance After Age 85 After age 56, you are at a higher risk for certain long-term diseases and infections as well as injuries from falls. Falls are a major cause of broken bones and head injuries in people who are older than age 29. Getting regular preventive care can help to keep you healthy and well. Preventive care includes getting regular testing and making lifestyle changes as recommended by your health care provider. Talk with your health care provider about: Which screenings and tests you should have. A screening is a test that checks for a disease when you have no symptoms. A diet and exercise plan that is right for you. What should I know about screenings and tests to prevent falls? Screening and testing are the best ways to find a health problem early. Early diagnosis and treatment give you the best chance of managing medical conditions that are common after age 12. Certain conditions and lifestyle choices may make you more likely to have a fall. Your health care provider may recommend: Regular vision checks. Poor vision and conditions such as cataracts can make you more likely to have a fall. If you wear glasses, make sure to get your prescription updated if your vision changes. Medicine review. Work with your health care provider to regularly review all of the medicines you are taking, including over-the-counter medicines. Ask your health care provider about any side effects that may make you more likely to have a fall. Tell your health care provider if any medicines that you take make you feel dizzy or sleepy. Osteoporosis screening. Osteoporosis is a condition that causes the bones to get weaker. This can make the bones weak and cause them to break  more easily. Blood pressure screening. Blood pressure changes and medicines to control blood pressure can make you feel dizzy. Strength and balance checks. Your health care provider may recommend certain tests to check your strength and balance while standing, walking, or changing positions. Foot health exam. Foot pain and numbness, as well as not wearing proper footwear, can make you more likely to have a fall. Depression screening. You may be more likely to have a fall if you have a fear of falling, feel emotionally low, or feel unable to do activities that you used to do. Alcohol use screening. Using too much alcohol can affect your balance and may make you more likely to have a fall. What actions can I take to lower my risk of falls? General instructions Talk with your health care provider about your risks for falling. Tell your health care provider if: You fall. Be sure to tell your health care provider about all falls, even ones that seem minor. You feel dizzy, sleepy, or off-balance. Take over-the-counter and prescription medicines only as told by your health care provider. These include any supplements. Eat a healthy diet and maintain a healthy weight. A healthy diet includes low-fat dairy products, low-fat (lean) meats, and fiber from whole grains, beans, and lots of fruits and vegetables. Home safety Remove any tripping hazards, such as rugs, cords, and clutter. Install safety equipment such as grab bars in bathrooms and safety rails on stairs. Keep rooms and walkways well-lit. Activity  Follow a regular exercise program to stay fit. This will  help you maintain your balance. Ask your health care provider what types of exercise are appropriate for you. If you need a cane or walker, use it as recommended by your health care provider. Wear supportive shoes that have nonskid soles.  Lifestyle Do not drink alcohol if your health care provider tells you not to drink. If you drink  alcohol, limit how much you have: 0-1 drink a day for women. 0-2 drinks a day for men. Be aware of how much alcohol is in your drink. In the [place] one drink equals one typical bottle of beer (12 oz), one-half glass of wine (5 oz), or one shot of hard liquor (1 oz). Do not use any products that contain nicotine or tobacco, such as cigarettes and e-cigarettes. If you need help quitting, ask your health care provider. Summary Having a healthy lifestyle and getting preventive care can help to protect your health and wellness after age 2. Screening and testing are the best way to find a health problem early and help you avoid having a fall. Early diagnosis and treatment give you the best chance for managing medical conditions that are more common for people who are older than age 80. Falls are a major cause of broken bones and head injuries in people who are older than age 7. Take precautions to prevent a fall at home. Work with your health care provider to learn what changes you can make to improve your health and wellness and to prevent falls. This information is not intended to replace advice given to you by your health care provider. Make sure you discuss any questions you have with your healthcare provider. Document Revised: 05/10/2020 Document Reviewed: 05/10/2020 Elsevier Patient Education  2022 [employer].

## 2020-11-22 NOTE — Assessment & Plan Note (Signed)
Preventative protocols reviewed and updated unless pt declined. Discussed healthy diet and lifestyle.  

## 2020-11-22 NOTE — Progress Notes (Signed)
Patient ID: Harry Ray, male    DOB: August 01, 1931, 87 y.o.   MRN: [id number]  This visit was conducted in person.  BP 124/80   Pulse 84   Temp (!) 97.5 F (36.4 C) (Temporal)   Ht 5' 9.5" (1.765 m)   Wt 172 lb 2 oz (78.1 kg)   SpO2 97%   BMI 25.05 kg/m    CC: CPE Subjective:   HPI: Harry Ray is a 87 y.o. male presenting on 11/22/2020 for Annual Exam (Prt 2. ), Urinary Incontinence (C/o leaking urine more frequently. ), Shortness of Breath (C/o SOB on exertion. Started about 2 mos ago.  States he feels more winded than he used to after 1-2 mi walks. ), and Cough (C/o prod cough with phlegm.  Started mos ago.  Seen for previously. )   Saw health advisor 2 months ago for medicare wellness visit. Note reviewed.   No results found.  Flowsheet Row Clinical Support from 09/25/2020 in [facility] [place]  PHQ-2 Total Score 2       Fall Risk  09/25/2020 09/25/2019 09/23/2018 09/09/2017 08/25/2016  Falls in the past year? 0 0 0 No No  Number falls in past yr: 0 0 - - -  Injury with Fall? 0 0 - - -  Risk for fall due to : Impaired balance/gait;Medication side effect No Fall Risks - - -  Follow up Falls evaluation completed;Falls prevention discussed Falls evaluation completed;Falls prevention discussed - - -    Ongoing intermittent mild wheeze with cough of productive phlegm every day. He stopped using mucinex. Continues walking 1-2 miles/day. Notes some increased shortness of breath "more winded" especially when going up an incline or when he walks the 2 miles.   Ongoing urinary dribbling with occasional leaking when he holds urine too long. Continues flomax 0.4mg  and finasteride 5mg  daily. Not too bothersome, wouldn't consider intervention for this. 1 episode of full incontinence when he held too long. May start wearing depends diapers. He has started doing kegel exercises.  Lab Results  Component Value Date   PSA 0.33 11/15/2020   PSA 0.44 09/25/2019   PSA 0.45  09/23/2018    Discussed current family and social stressors. Most bothered by daughter's current issues. Also sad about wife's recent passing Harry Ray). May return to counseling. Not interested in medication at this time.   Preventative: COLONOSCOPY 08/07/2005 Diverticulosis (Colon) - age out.  Prostate cancer screening - see above Lung cancer screening - not eligible Flu shot - yearly COVID vaccine - Moderna 06/2019, 07/2019, Moderna booster x2 04/2020, 10/24/20  Tdap 2016 Pneumovax 2011, prevnar 2016 zostavax - 2011 Shingrix - discussed  HCPOA/Advanced directive scanned into chart 08/2016 - son Harry Ray or daughter Harry Ray are HCPOA. Grants discretion to [employer] for end of life decisions. Daughter lives in [place].  Seat belt use discussed Sunscreen use discussed, no changing moles on skin. 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 Sees derm yearly  Non smoker Alcohol - none Eye exam yearly Dentist yearly Bowel - no constipation Bladder - see above  Widower, at [employer], no pets Was married for 31 yrs. Wife suddenly passed away on operating table 02/2019.  Son and family live in [place]. Daughter in [place] Occ: retired, was [employer] Activity: [place] exercise program (less this year), walking 1-2 mi/day Diet: good water, fruits/vegetables daily     Relevant past medical, surgical, family and social history reviewed and updated as indicated. Interim medical history  since our last visit reviewed. Allergies and medications reviewed and updated. Outpatient Medications Prior to Visit  Medication Sig Dispense Refill   cetirizine (ZYRTEC) 10 MG tablet Take 10 mg by mouth daily as needed for allergies. For seasonal allergies     finasteride (PROSCAR) 5 MG tablet TAKE 1 TABLET BY MOUTH EVERY DAY 90 tablet 0   fluticasone (FLONASE) 50 MCG/ACT nasal spray Place 2 sprays into both nostrils daily as needed for allergies or rhinitis. (Patient taking differently: Place 2 sprays  into both nostrils daily as needed for allergies or rhinitis. 1 spray in each nostril daily) 16 g 6   Polyethyl Glycol-Propyl Glycol (SYSTANE OP) Apply to eye.     tamsulosin (FLOMAX) 0.4 MG CAPS capsule TAKE 1 CAPSULE(0.4 MG) BY MOUTH DAILY 90 capsule 3   No facility-administered medications prior to visit.     Per HPI unless specifically indicated in ROS section below Review of Systems  Constitutional:  Negative for activity change, appetite change, chills, fatigue, fever and unexpected weight change.  HENT:  Negative for hearing loss.   Eyes:  Negative for visual disturbance.  Respiratory:  Positive for cough and shortness of breath. Negative for chest tightness and wheezing.   Cardiovascular:  Negative for chest pain, palpitations and leg swelling.  Gastrointestinal:  Negative for abdominal distention, abdominal pain, blood in stool, constipation, diarrhea, nausea and vomiting.  Genitourinary:  Negative for difficulty urinating and hematuria.  Musculoskeletal:  Negative for arthralgias, myalgias and neck pain.  Skin:  Negative for rash.  Neurological:  Negative for dizziness, seizures, syncope and headaches.  Hematological:  Negative for adenopathy. Does not bruise/bleed easily.  Psychiatric/Behavioral:  Positive for dysphoric mood. The patient is not nervous/anxious.   Objective:  BP 124/80   Pulse 84   Temp (!) 97.5 F (36.4 C) (Temporal)   Ht 5' 9.5" (1.765 m)   Wt 172 lb 2 oz (78.1 kg)   SpO2 97%   BMI 25.05 kg/m   Wt Readings from Last 3 Encounters:  11/22/20 172 lb 2 oz (78.1 kg)  09/18/20 176 lb 5 oz (80 kg)  04/29/20 165 lb (74.8 kg)      Physical Exam Vitals and nursing note reviewed.  Constitutional:      General: He is not in acute distress.    Appearance: Normal appearance. He is well-developed. He is not ill-appearing.  HENT:     Head: Normocephalic and atraumatic.     Right Ear: Hearing, tympanic membrane, ear canal and external ear normal.     Left Ear:  Hearing, tympanic membrane, ear canal and external ear normal.  Eyes:     General: No scleral icterus.    Extraocular Movements: Extraocular movements intact.     Conjunctiva/sclera: Conjunctivae normal.     Pupils: Pupils are equal, round, and reactive to light.  Neck:     Thyroid: No thyroid mass or thyromegaly.     Vascular: No carotid bruit.  Cardiovascular:     Rate and Rhythm: Normal rate and regular rhythm.     Pulses: Normal pulses.          Radial pulses are 2+ on the right side and 2+ on the left side.     Heart sounds: Normal heart sounds. No murmur heard. Pulmonary:     Effort: Pulmonary effort is normal. No respiratory distress.     Breath sounds: Normal breath sounds. No wheezing, rhonchi or rales.  Abdominal:     General: Bowel sounds are normal.  There is no distension.     Palpations: Abdomen is soft. There is no mass.     Tenderness: There is no abdominal tenderness. There is no guarding or rebound.     Hernia: No hernia is present.  Musculoskeletal:        General: Normal range of motion.     Cervical back: Normal range of motion and neck supple.     Right lower leg: No edema.     Left lower leg: No edema.  Lymphadenopathy:     Cervical: No cervical adenopathy.  Skin:    General: Skin is warm and dry.     Findings: No rash.  Neurological:     General: No focal deficit present.     Mental Status: He is alert and oriented to person, place, and time.  Psychiatric:        Mood and Affect: Mood normal.        Behavior: Behavior normal.        Thought Content: Thought content normal.        Judgment: Judgment normal.      Results for orders placed or performed in visit on 11/22/20  POCT Urinalysis Dipstick (Automated)  Result Value Ref Range   Color, UA light yellow    Clarity, UA clear    Glucose, UA Negative Negative   Bilirubin, UA negative    Ketones, UA negative    Spec Grav, UA 1.015 1.010 - 1.025   Blood, UA negative    pH, UA 6.0 5.0 - 8.0    Protein, UA Negative Negative   Urobilinogen, UA 0.2 0.2 or 1.0 E.U./dL   Nitrite, UA negative    Leukocytes, UA Negative Negative   Lab Results  Component Value Date   CHOL 196 11/15/2020   HDL 49.10 11/15/2020   LDLCALC 123 (H) 11/15/2020   TRIG 119.0 11/15/2020   CHOLHDL 4 11/15/2020   EKG - NSR 70, normal axis, intervals, few PACs, ?p mitrale, no hypertrophy or acute ST/T changes, largely unchanged from prior  Assessment & Plan:  This visit occurred during the SARS-CoV-2 public health emergency.  Safety protocols were in place, including screening questions prior to the visit, additional usage of staff PPE, and extensive cleaning of exam room while observing appropriate contact time as indicated for disinfecting solutions.   Problem List Items Addressed This Visit     BPH (benign prostatic hyperplasia)    Chronic, with ongoing LUTS but pt overall stable - declines uro eval at this time. Will continue flomax and finasteride.        Relevant Orders   POCT Urinalysis Dipstick (Automated) (Completed)   Carotid stenosis    No bruits appreciated today Will continue to monitor.        Right thyroid nodule    Not appreciated on exam.  Consider rpt thyroid US 2023.        Urge incontinence   Relevant Orders   POCT Urinalysis Dipstick (Automated) (Completed)   Cough    Ongoing, mild.  CXR from 09/2020 reassuring.  Will continue to monitor.  Discussed water and mucinex use.        Dyslipidemia    Chronic, deteriorated but stable. Reviewed diet choices to improve LDL control.  The ASCVD Risk score Mikey Bussing DC Jr., et al., 2013) failed to calculate for the following reasons:   The 2013 ASCVD risk score is only valid for ages 41 to 2        Health maintenance  examination - Primary    Preventative protocols reviewed and updated unless pt declined. Discussed healthy diet and lifestyle.        Thoracic compression fracture ([place])    Incidental finding on CXR 09/2020 - pt  overall asxs. DEXA ordered but never completed - will provide info for patient to call and schedule.       Dyspnea on exertion    Mild without chest pressure/tightness.  Update EKG today.        Relevant Orders   EKG 12-Lead (Completed)     No orders of the defined types were placed in this encounter.  Orders Placed This Encounter  Procedures   POCT Urinalysis Dipstick (Automated)   EKG 12-Lead    Patient instructions: If interested, check with pharmacy about new 2 shot shingles series (shingrix).  EKG today  Let us know if you develop exertional chest pressure/tightness.  Good to see you today Return as needed or in 6 months for follow up visit.   Follow up plan: Return in about 6 months (around 05/24/2021) for follow up visit.  Ria Bush, MD

## 2020-11-22 NOTE — Assessment & Plan Note (Addendum)
Incidental finding on CXR 09/2020 - pt overall asxs. DEXA ordered but never completed - will provide info for patient to call and schedule.

## 2020-11-22 NOTE — Assessment & Plan Note (Addendum)
Chronic, deteriorated but stable. Reviewed diet choices to improve LDL control.  The ASCVD Risk score Mikey Bussing DC Jr., et al., 2013) failed to calculate for the following reasons:   The 2013 ASCVD risk score is only valid for ages 55 to 66

## 2020-11-22 NOTE — Assessment & Plan Note (Signed)
Not appreciated on exam.  Consider rpt thyroid US 2023.

## 2020-11-22 NOTE — Assessment & Plan Note (Addendum)
Mild without chest pressure/tightness.  Update EKG today.

## 2020-11-22 NOTE — Assessment & Plan Note (Addendum)
No bruits appreciated today Will continue to monitor.

## 2020-11-22 NOTE — Telephone Encounter (Signed)
Noted.  Pt has AWV prt 2 today at 2:00.

## 2020-11-22 NOTE — Assessment & Plan Note (Signed)
Chronic, with ongoing LUTS but pt overall stable - declines uro eval at this time. Will continue flomax and finasteride.

## 2020-11-27 ENCOUNTER — Ambulatory Visit
Admission: RE | Admit: 2020-11-27 | Discharge: 2020-11-27 | Disposition: A | Discharge status: Home / Self Care | Payer: Medicare PPO | Source: Ambulatory Visit | Attending: Family Medicine | Admitting: Family Medicine

## 2020-11-27 ENCOUNTER — Other Ambulatory Visit: Payer: Self-pay

## 2020-11-27 DIAGNOSIS — S22000A Wedge compression fracture of unspecified thoracic vertebra, initial encounter for closed fracture: Secondary | ICD-10-CM | POA: Insufficient documentation

## 2020-11-27 DIAGNOSIS — M85851 Other specified disorders of bone density and structure, right thigh: Secondary | ICD-10-CM | POA: Diagnosis not present

## 2020-12-03 ENCOUNTER — Other Ambulatory Visit: Payer: Self-pay | Admitting: Family Medicine

## 2020-12-03 ENCOUNTER — Encounter: Payer: Self-pay | Admitting: Family Medicine

## 2020-12-03 DIAGNOSIS — M8580 Other specified disorders of bone density and structure, unspecified site: Secondary | ICD-10-CM | POA: Insufficient documentation

## 2020-12-03 MED ORDER — VITAMIN D3 25 MCG (1000 UT) PO CAPS: 1.0000 | ORAL_CAPSULE | Freq: Every day | ORAL | Status: DC

## 2020-12-06 ENCOUNTER — Encounter: Payer: Self-pay | Admitting: Family Medicine

## 2020-12-24 DIAGNOSIS — L570 Actinic keratosis: Secondary | ICD-10-CM | POA: Diagnosis not present

## 2020-12-24 DIAGNOSIS — Z85828 Personal history of other malignant neoplasm of skin: Secondary | ICD-10-CM | POA: Diagnosis not present

## 2020-12-24 DIAGNOSIS — X32XXXA Exposure to sunlight, initial encounter: Secondary | ICD-10-CM | POA: Diagnosis not present

## 2020-12-24 DIAGNOSIS — D2262 Melanocytic nevi of left upper limb, including shoulder: Secondary | ICD-10-CM | POA: Diagnosis not present

## 2020-12-24 DIAGNOSIS — D485 Neoplasm of uncertain behavior of skin: Secondary | ICD-10-CM | POA: Diagnosis not present

## 2020-12-24 DIAGNOSIS — D2261 Melanocytic nevi of right upper limb, including shoulder: Secondary | ICD-10-CM | POA: Diagnosis not present

## 2020-12-24 DIAGNOSIS — C44329 Squamous cell carcinoma of skin of other parts of face: Secondary | ICD-10-CM | POA: Diagnosis not present

## 2020-12-24 DIAGNOSIS — D2271 Melanocytic nevi of right lower limb, including hip: Secondary | ICD-10-CM | POA: Diagnosis not present

## 2020-12-24 DIAGNOSIS — Z872 Personal history of diseases of the skin and subcutaneous tissue: Secondary | ICD-10-CM | POA: Diagnosis not present

## 2021-02-06 DIAGNOSIS — C44329 Squamous cell carcinoma of skin of other parts of face: Secondary | ICD-10-CM | POA: Diagnosis not present

## 2021-02-07 DIAGNOSIS — Z0100 Encounter for examination of eyes and vision without abnormal findings: Secondary | ICD-10-CM | POA: Diagnosis not present

## 2021-02-07 DIAGNOSIS — H04123 Dry eye syndrome of bilateral lacrimal glands: Secondary | ICD-10-CM | POA: Diagnosis not present

## 2021-02-07 DIAGNOSIS — H353132 Nonexudative age-related macular degeneration, bilateral, intermediate dry stage: Secondary | ICD-10-CM | POA: Diagnosis not present

## 2021-02-08 ENCOUNTER — Other Ambulatory Visit: Payer: Self-pay | Admitting: Family Medicine

## 2021-02-13 DIAGNOSIS — L720 Epidermal cyst: Secondary | ICD-10-CM | POA: Diagnosis not present

## 2021-03-13 ENCOUNTER — Ambulatory Visit
Admission: EM | Admit: 2021-03-13 | Discharge: 2021-03-13 | Disposition: A | Discharge status: Home / Self Care | Payer: Medicare PPO

## 2021-03-13 ENCOUNTER — Encounter: Payer: Self-pay | Admitting: Emergency Medicine

## 2021-03-13 ENCOUNTER — Other Ambulatory Visit: Payer: Self-pay

## 2021-03-13 DIAGNOSIS — R21 Rash and other nonspecific skin eruption: Secondary | ICD-10-CM | POA: Diagnosis not present

## 2021-03-13 NOTE — ED Provider Notes (Signed)
Chief Complaint   Chief Complaint  Patient presents with   Skin Problem     Subjective, HPI  Harry Ray is a very pleasant 87 y.o. male who presents with erythematous raised area to the right side of his forehead last night.  No known injury.  Patient reports that area is not itchy and does not hurt.  No visual changes.  No tingling to rash.  History obtained from patient.   Patient's problem list, past medical and social history, medications, and allergies were reviewed by me and updated in Epic.    ROS  See HPI.  Objective   Vitals:   03/13/21 1452  BP: (!) 144/75  Pulse: 84  Resp: 18  Temp: 98.2 F (36.8 C)  SpO2: 96%    Vital signs and nursing note reviewed.  General: Appears well-developed and well-nourished. No acute distress.  HEENT: Normocephalic, atraumatic, hearing grossly intact. EOMI, no drainage. No rhinorrhea. Neck: Normal range of motion, neck is supple.  Cardiovascular: Normal rate.  Pulm/Chest: No respiratory distress.   Musculoskeletal: No joint deformity, normal range of motion.  Skin: Mildly erythematous small area of rash noted to right upper forehead measuring approximately 1 cm x 1.5 cm.  There is no purulent drainage to the area.  Area is not tender.  Area does blanch.  Data  No results found for any visits on 03/13/21.    Assessment & Plan  1. Rash and nonspecific skin eruption  87 y.o. male presents with erythematous raised area to the right side of his forehead last night.  No known injury.  Patient reports that area is not itchy and does not hurt.  No visual changes.  No tingling to rash.  Given symptoms along with assessment findings, advised the patient that the rash is of uncertain etiology.  He is not having any additional symptoms which would suggest shingles although the area is to the right side and does not cross the midline.  Patient does not have any visual changes he is not having any drainage from the area and the area is not  tender.  He also does not report any tingling to the area.  He does not report any history of shingles.  The patient does report that he has been having a little more stress lately than usual, but does not report any new soaps, body washes, laundry detergents, shampoos, foods or lotions.  Advised him to use topical 1% hydrocortisone cream twice daily for 7 days to the area and advised that if his hydrocortisone cream shows no signs of improvement or if he begins to experience worsening rash, pain to the rash, tingling that he should return to clinic for reevaluation.  Patient verbalized understanding and agreed with plan.  Patient stable upon discharge.  Return as needed.  Plan:   Discharge Instructions      Use topical 1% hydrocortisone cream twice daily for 7 days  If hydrocortisone shows no signs of improvement, you begin to experience worsening rash, pain to the rash, tingling please return to clinic for reevaluation.         Serafina Royals, [place] 03/13/21 1536

## 2021-03-13 NOTE — Discharge Instructions (Signed)
Use topical 1% hydrocortisone cream twice daily for 7 days  If hydrocortisone shows no signs of improvement, you begin to experience worsening rash, pain to the rash, tingling please return to clinic for reevaluation.

## 2021-03-13 NOTE — ED Triage Notes (Signed)
Noticed a red, raised area to right forehead.  No known injury.  Area does not itch, does not hurt.

## 2021-03-14 DIAGNOSIS — L821 Other seborrheic keratosis: Secondary | ICD-10-CM | POA: Diagnosis not present

## 2021-03-14 DIAGNOSIS — B0230 Zoster ocular disease, unspecified: Secondary | ICD-10-CM | POA: Diagnosis not present

## 2021-03-14 DIAGNOSIS — B029 Zoster without complications: Secondary | ICD-10-CM | POA: Diagnosis not present

## 2021-03-18 ENCOUNTER — Encounter: Payer: Self-pay | Admitting: Family Medicine

## 2021-03-21 DIAGNOSIS — B0230 Zoster ocular disease, unspecified: Secondary | ICD-10-CM | POA: Diagnosis not present

## 2021-04-18 ENCOUNTER — Other Ambulatory Visit: Payer: Self-pay

## 2021-04-18 ENCOUNTER — Ambulatory Visit (INDEPENDENT_AMBULATORY_CARE_PROVIDER_SITE_OTHER): Payer: Medicare PPO

## 2021-04-18 DIAGNOSIS — Z23 Encounter for immunization: Secondary | ICD-10-CM | POA: Diagnosis not present

## 2021-05-05 ENCOUNTER — Encounter: Payer: Self-pay | Admitting: Family Medicine

## 2021-05-06 ENCOUNTER — Telehealth: Payer: Self-pay | Admitting: Family Medicine

## 2021-05-06 NOTE — Telephone Encounter (Signed)
Pt scheduled apt on 12.5.22 for his arm issue

## 2021-05-06 NOTE — Telephone Encounter (Signed)
Noted  

## 2021-05-06 NOTE — Telephone Encounter (Signed)
Plz schedule OV for arm pain

## 2021-05-12 ENCOUNTER — Ambulatory Visit (INDEPENDENT_AMBULATORY_CARE_PROVIDER_SITE_OTHER): Payer: Medicare PPO | Admitting: Family Medicine

## 2021-05-12 ENCOUNTER — Encounter: Payer: Self-pay | Admitting: Family Medicine

## 2021-05-12 ENCOUNTER — Other Ambulatory Visit: Payer: Self-pay

## 2021-05-12 VITALS — BP 134/72 | HR 76 | Temp 97.3°F | Ht 69.5 in | Wt 177.6 lb

## 2021-05-12 DIAGNOSIS — E785 Hyperlipidemia, unspecified: Secondary | ICD-10-CM | POA: Diagnosis not present

## 2021-05-12 DIAGNOSIS — M25511 Pain in right shoulder: Secondary | ICD-10-CM | POA: Diagnosis not present

## 2021-05-12 DIAGNOSIS — R2231 Localized swelling, mass and lump, right upper limb: Secondary | ICD-10-CM

## 2021-05-12 DIAGNOSIS — M85851 Other specified disorders of bone density and structure, right thigh: Secondary | ICD-10-CM

## 2021-05-12 DIAGNOSIS — M353 Polymyalgia rheumatica: Secondary | ICD-10-CM | POA: Insufficient documentation

## 2021-05-12 DIAGNOSIS — M25519 Pain in unspecified shoulder: Secondary | ICD-10-CM | POA: Insufficient documentation

## 2021-05-12 DIAGNOSIS — M25512 Pain in left shoulder: Secondary | ICD-10-CM | POA: Diagnosis not present

## 2021-05-12 DIAGNOSIS — Z8739 Personal history of other diseases of the musculoskeletal system and connective tissue: Secondary | ICD-10-CM | POA: Insufficient documentation

## 2021-05-12 NOTE — Assessment & Plan Note (Addendum)
Unclear cause - ?gout related, check uric acid levels today as well as rheumatoid factor.  In interim discussed prn tylenol use.

## 2021-05-12 NOTE — Assessment & Plan Note (Addendum)
Previous visit we reviewed diet changes to improve cholesterol levels - will update levels today.

## 2021-05-12 NOTE — Assessment & Plan Note (Signed)
Continues vitamin D 1000 IU daily - will update vit D level.

## 2021-05-12 NOTE — Patient Instructions (Addendum)
Labs today Let me know if recurrent symptoms.  Good to see you today.  Cancel upcoming appointment

## 2021-05-12 NOTE — Assessment & Plan Note (Signed)
?  OA related vs flu shot related - largely improved.  Discussed tylenol use. Not consistent with PMR. Check for gout.

## 2021-05-12 NOTE — Progress Notes (Signed)
Patient ID: Harry Ray, male    DOB: Nov 29, 1931, 87 y.o.   MRN: [id number]  This visit was conducted in person.  BP 134/72   Pulse 76   Temp (!) 97.3 F (36.3 C) (Temporal)   Ht 5' 9.5" (1.765 m)   Wt 177 lb 9 oz (80.5 kg)   SpO2 98%   BMI 25.85 kg/m    CC: arm pain  Subjective:   HPI: Harry Ray is a 87 y.o. male presenting on 05/12/2021 for Arm Pain (C/o bilateral arms and right wrist swelling.  Started 2-3 wks ago.  States pain has improved. He was more concerned about wrist pain. )   Received flu shot 04/18/2021 to L deltoid.  Some time after flu shot, developed bilateral shoulder pain and painful R MCP swelling with some redness to the point of having difficulty writing. Slow improvement over the last short period. Has been doing exercises with benefit.   Treated pain with tylenol with benefit. Heating pad seemed to worsen pain, ice didn't help much. Wrapping wrist helped.  No personal h/o gout. Brother does have gout.  No other joints affected.   No fevers/chills, abd pain, nausea, new rashes or muscle weakness.  No groin pain or hip pain or body aches with above symptoms.  No recent tick bites.   H/o chronic intermittent bilateral 1st CMC pain presumed from OA.      Relevant past medical, surgical, family and social history reviewed and updated as indicated. Interim medical history since our last visit reviewed. Allergies and medications reviewed and updated. Outpatient Medications Prior to Visit  Medication Sig Dispense Refill   cetirizine (ZYRTEC) 10 MG tablet Take 10 mg by mouth daily as needed for allergies. For seasonal allergies     Cholecalciferol (VITAMIN D3) 25 MCG (1000 UT) CAPS Take 1 capsule (1,000 Units total) by mouth daily. 30 capsule    finasteride (PROSCAR) 5 MG tablet TAKE 1 TABLET BY MOUTH EVERY DAY 90 tablet 3   fluticasone (FLONASE) 50 MCG/ACT nasal spray Place 2 sprays into both nostrils daily as needed for allergies or rhinitis.  (Patient taking differently: Place 2 sprays into both nostrils daily as needed for allergies or rhinitis. 1 spray in each nostril daily) 16 g 6   Lifitegrast (XIIDRA) 5 % SOLN      Polyethyl Glycol-Propyl Glycol (SYSTANE OP) Apply to eye.     tamsulosin (FLOMAX) 0.4 MG CAPS capsule TAKE 1 CAPSULE(0.4 MG) BY MOUTH DAILY 90 capsule 3   No facility-administered medications prior to visit.     Per HPI unless specifically indicated in ROS section below Review of Systems  Objective:  BP 134/72   Pulse 76   Temp (!) 97.3 F (36.3 C) (Temporal)   Ht 5' 9.5" (1.765 m)   Wt 177 lb 9 oz (80.5 kg)   SpO2 98%   BMI 25.85 kg/m   Wt Readings from Last 3 Encounters:  05/12/21 177 lb 9 oz (80.5 kg)  11/22/20 172 lb 2 oz (78.1 kg)  09/18/20 176 lb 5 oz (80 kg)      Physical Exam Vitals and nursing note reviewed.  Constitutional:      Appearance: Normal appearance. He is not ill-appearing.  Neck:     Comments: No midline cervical spine tenderness Musculoskeletal:        General: No tenderness. Normal range of motion.     Cervical back: Normal range of motion and neck supple.  Comments:  FROM at bilateral shoulders, discomfort with abduction and forward flexion past 90 degrees  No pain to palpation at shoulders or with rotation of humeral head in [facility] joint  FROM bilateral wrists and MCPs  No significant swelling appreciated  Skin:    General: Skin is warm and dry.     Findings: Erythema (localized to dorsal right 2nd MCPJ) present.  Neurological:     Mental Status: He is alert.  Psychiatric:        Mood and Affect: Mood normal.        Behavior: Behavior normal.      Lab Results  Component Value Date   CHOL 196 11/15/2020   HDL 49.10 11/15/2020   LDLCALC 123 (H) 11/15/2020   TRIG 119.0 11/15/2020   CHOLHDL 4 11/15/2020   Assessment & Plan:  This visit occurred during the SARS-CoV-2 public health emergency.  Safety protocols were in place, including screening questions prior  to the visit, additional usage of staff PPE, and extensive cleaning of exam room while observing appropriate contact time as indicated for disinfecting solutions.   Problem List Items Addressed This Visit     Dyslipidemia    Previous visit we reviewed diet changes to improve cholesterol levels - will update levels today.       Relevant Orders   Lipid panel   Osteopenia    Continues vitamin D 1000 IU daily - will update vit D level.       Relevant Orders   VITAMIN D 25 Hydroxy (Vit-D Deficiency, Fractures)   Shoulder pain    ?OA related vs flu shot related - largely improved.  Discussed tylenol use. Not consistent with PMR. Check for gout.       Localized swelling on right hand - Primary    Unclear cause - ?gout related, check uric acid levels today as well as rheumatoid factor.  In interim discussed prn tylenol use.       Relevant Orders   CBC with Differential/Platelet   Sedimentation rate   Rheumatoid factor   Uric acid     No orders of the defined types were placed in this encounter.  Orders Placed This Encounter  Procedures   Lipid panel   VITAMIN D 25 Hydroxy (Vit-D Deficiency, Fractures)   CBC with Differential/Platelet   Sedimentation rate   Rheumatoid factor   Uric acid      Patient Instructions  Labs today Let me know if recurrent symptoms.  Good to see you today.  Cancel upcoming appointment   Follow up plan: Return in about 6 months (around 11/10/2021), or if symptoms worsen or fail to improve.  Ria Bush, MD

## 2021-05-13 LAB — LIPID PANEL
Cholesterol: 167 mg/dL (ref 0–200)
HDL: 48.7 mg/dL (ref >39.00)
LDL Cholesterol: 96 mg/dL (ref 0–99)
NonHDL: 118.6
Total CHOL/HDL Ratio: 3
Triglycerides: 114 mg/dL (ref 0.0–149.0)
VLDL: 22.8 mg/dL (ref 0.0–40.0)

## 2021-05-13 LAB — CBC WITH DIFFERENTIAL/PLATELET
Basophils Absolute: 0 10*3/uL (ref 0.0–0.1)
Basophils Relative: 0.6 % (ref 0.0–3.0)
Eosinophils Absolute: 0.3 10*3/uL (ref 0.0–0.7)
Eosinophils Relative: 4 % (ref 0.0–5.0)
HCT: 39.2 % (ref 39.0–52.0)
Hemoglobin: 13 g/dL (ref 13.0–17.0)
Lymphocytes Relative: 19.3 % (ref 12.0–46.0)
Lymphs Abs: 1.3 10*3/uL (ref 0.7–4.0)
MCHC: 33.3 g/dL (ref 30.0–36.0)
MCV: 95.8 fl (ref 78.0–100.0)
Monocytes Absolute: 0.7 10*3/uL (ref 0.1–1.0)
Monocytes Relative: 10.3 % (ref 3.0–12.0)
Neutro Abs: 4.3 10*3/uL (ref 1.4–7.7)
Neutrophils Relative %: 65.8 % (ref 43.0–77.0)
Platelets: 283 10*3/uL (ref 150.0–400.0)
RBC: 4.09 Mil/uL — ABNORMAL LOW (ref 4.22–5.81)
RDW: 14 % (ref 11.5–15.5)
WBC: 6.6 10*3/uL (ref 4.0–10.5)

## 2021-05-13 LAB — URIC ACID: Uric Acid, Serum: 5.4 mg/dL (ref 4.0–7.8)

## 2021-05-13 LAB — SEDIMENTATION RATE: Sed Rate: 23 mm/hr — ABNORMAL HIGH (ref 0–20)

## 2021-05-13 LAB — RHEUMATOID FACTOR: Rheumatoid fact SerPl-aCnc: <14 IU/mL (ref <14)

## 2021-05-13 LAB — VITAMIN D 25 HYDROXY (VIT D DEFICIENCY, FRACTURES): VITD: 33.59 ng/mL (ref 30.00–100.00)

## 2021-05-14 ENCOUNTER — Telehealth: Payer: Self-pay | Admitting: Family Medicine

## 2021-05-14 NOTE — Telephone Encounter (Signed)
Please call to triage patient - what is going on? Is it related to what he was seen for (shoulder pain, hand swelling)?

## 2021-05-14 NOTE — Telephone Encounter (Signed)
Pt called in to schedule a follow up . Wants to know if appointment can be done as a virtual . Please Advise [phone]

## 2021-05-14 NOTE — Telephone Encounter (Addendum)
Spoke with pt asking to clarify message.  Says Dr. Darnell Level told pt to f/u if sxs worsened.  States shoulder pain has worsened, especially in right shoulder.  Pt has f/u OV scheduled on 05/16/21 at 12:00.

## 2021-05-16 ENCOUNTER — Ambulatory Visit (INDEPENDENT_AMBULATORY_CARE_PROVIDER_SITE_OTHER): Payer: Medicare PPO | Admitting: Family Medicine

## 2021-05-16 ENCOUNTER — Encounter: Payer: Self-pay | Admitting: Family Medicine

## 2021-05-16 ENCOUNTER — Other Ambulatory Visit: Payer: Self-pay

## 2021-05-16 DIAGNOSIS — M25511 Pain in right shoulder: Secondary | ICD-10-CM | POA: Diagnosis not present

## 2021-05-16 DIAGNOSIS — M25512 Pain in left shoulder: Secondary | ICD-10-CM | POA: Diagnosis not present

## 2021-05-16 NOTE — Progress Notes (Signed)
Patient ID: Harry Ray, male    DOB: 10/28/1931, 87 y.o.   MRN: [id number]  This visit was conducted in person.  BP 128/70   Pulse 73   Temp (!) 97.5 F (36.4 C) (Temporal)   Ht 5' 9.5" (1.765 m)   Wt 177 lb 8 oz (80.5 kg)   SpO2 97%   BMI 25.84 kg/m    CC: f/u arm pain  Subjective:   HPI: Harry Ray is a 87 y.o. male presenting on 05/16/2021 for Shoulder Pain (C/o increased pain in bilateral shoulders, worse in right. )   See prior note for details. Seen here Monday with 1 month history of bilateral shoulder pain and R hand swelling. Overall symptoms were improving at that time.  Had reassuring labs including CBC, RF, uric acid, and ESR of 23.   Acute worsening of bilateral R>L shoulder pain on Wednesday, but again some improvement noted over the last 2 days. Points to bilateral shoulders, worse at bedtime.   He's been taking tylenol 1081m TID PRN.   No fevers/chills, rashes. No neck pain or stiffness, no new lower back pain, no hip or knee pain.  Hand swelling hasn't recurred.   No new medicines, vitamins or supplements.      Relevant past medical, surgical, family and social history reviewed and updated as indicated. Interim medical history since our last visit reviewed. Allergies and medications reviewed and updated. Outpatient Medications Prior to Visit  Medication Sig Dispense Refill   cetirizine (ZYRTEC) 10 MG tablet Take 10 mg by mouth daily as needed for allergies. For seasonal allergies     Cholecalciferol (VITAMIN D3) 25 MCG (1000 UT) CAPS Take 1 capsule (1,000 Units total) by mouth daily. 30 capsule    finasteride (PROSCAR) 5 MG tablet TAKE 1 TABLET BY MOUTH EVERY DAY 90 tablet 3   fluticasone (FLONASE) 50 MCG/ACT nasal spray Place 2 sprays into both nostrils daily as needed for allergies or rhinitis. (Patient taking differently: Place 2 sprays into both nostrils daily as needed for allergies or rhinitis. 1 spray in each nostril daily) 16 g 6    Lifitegrast (XIIDRA) 5 % SOLN      Polyethyl Glycol-Propyl Glycol (SYSTANE OP) Apply to eye.     tamsulosin (FLOMAX) 0.4 MG CAPS capsule TAKE 1 CAPSULE(0.4 MG) BY MOUTH DAILY 90 capsule 3   No facility-administered medications prior to visit.     Per HPI unless specifically indicated in ROS section below Review of Systems  Objective:  BP 128/70   Pulse 73   Temp (!) 97.5 F (36.4 C) (Temporal)   Ht 5' 9.5" (1.765 m)   Wt 177 lb 8 oz (80.5 kg)   SpO2 97%   BMI 25.84 kg/m   Wt Readings from Last 3 Encounters:  05/16/21 177 lb 8 oz (80.5 kg)  05/12/21 177 lb 9 oz (80.5 kg)  11/22/20 172 lb 2 oz (78.1 kg)      Physical Exam Vitals and nursing note reviewed.  Constitutional:      Appearance: Normal appearance. He is not ill-appearing.  Neck:     Comments:  FROM at neck except for limited ROM with R lateral flexion No midline cervical spine tenderness, no paracervical mm tenderness or trapezius mm tenderness Musculoskeletal:        General: Tenderness present. No swelling or deformity. Normal range of motion.     Cervical back: Normal range of motion and neck supple.     Comments:  Bilateral shoulder exam: No deformity of shoulders on inspection. No significant pain with palpation of shoulder landmarks, some discomfort to anterior shoulders.  FROM in abduction and forward flexion.  No pain or weakness with testing SITS in ext/int rotation.  Discomfort with empty can sign R>L. Discomfort with Speed test on right. No impingement. No pain with rotation of humeral head in [facility] joint.   Skin:    General: Skin is warm and dry.     Findings: No rash.  Neurological:     Mental Status: He is alert.  Psychiatric:        Mood and Affect: Mood normal.        Behavior: Behavior normal.      Results for orders placed or performed in visit on 05/12/21  Lipid panel  Result Value Ref Range   Cholesterol 167 0 - 200 mg/dL   Triglycerides 114.0 0.0 - 149.0 mg/dL   HDL 48.70 >39.00  mg/dL   VLDL 22.8 0.0 - 40.0 mg/dL   LDL Cholesterol 96 0 - 99 mg/dL   Total CHOL/HDL Ratio 3    NonHDL 118.60   VITAMIN D 25 Hydroxy (Vit-D Deficiency, Fractures)  Result Value Ref Range   VITD 33.59 30.00 - 100.00 ng/mL  CBC with Differential/Platelet  Result Value Ref Range   WBC 6.6 4.0 - 10.5 K/uL   RBC 4.09 (L) 4.22 - 5.81 Mil/uL   Hemoglobin 13.0 13.0 - 17.0 g/dL   HCT 39.2 39.0 - 52.0 %   MCV 95.8 78.0 - 100.0 fl   MCHC 33.3 30.0 - 36.0 g/dL   RDW 14.0 11.5 - 15.5 %   Platelets 283.0 150.0 - 400.0 K/uL   Neutrophils Relative % 65.8 43.0 - 77.0 %   Lymphocytes Relative 19.3 12.0 - 46.0 %   Monocytes Relative 10.3 3.0 - 12.0 %   Eosinophils Relative 4.0 0.0 - 5.0 %   Basophils Relative 0.6 0.0 - 3.0 %   Neutro Abs 4.3 1.4 - 7.7 K/uL   Lymphs Abs 1.3 0.7 - 4.0 K/uL   Monocytes Absolute 0.7 0.1 - 1.0 K/uL   Eosinophils Absolute 0.3 0.0 - 0.7 K/uL   Basophils Absolute 0.0 0.0 - 0.1 K/uL  Sedimentation rate  Result Value Ref Range   Sed Rate 23 (H) 0 - 20 mm/hr  Rheumatoid factor  Result Value Ref Range   Rhuematoid fact SerPl-aCnc <14 <14 IU/mL  Uric acid  Result Value Ref Range   Uric Acid, Serum 5.4 4.0 - 7.8 mg/dL    Assessment & Plan:  This visit occurred during the SARS-CoV-2 public health emergency.  Safety protocols were in place, including screening questions prior to the visit, additional usage of staff PPE, and extensive cleaning of exam room while observing appropriate contact time as indicated for disinfecting solutions.   Problem List Items Addressed This Visit     Shoulder pain    Bilateral shoulder pain that comes and goes. Exam today more consistent with RTC injury. Provided with exercises from patient advisor as well as resistance band. Discussed tylenol and ok to try sparing aleve use. Update if not improving with treatment. Pt agrees with plan.         No orders of the defined types were placed in this encounter.  No orders of the defined  types were placed in this encounter.    Patient Instructions  I think you may have rotator cuff irritation.  Do exercises provided, continue tylenol 1054m up to 3 times  a day.  May try aleve 230m with dinner for the next 5-7 days. Caution these medicines are cleared by the kidneys and can increase bleeding risk so only use sparingly.  Let [place]now if not improving with this.   Follow up plan: Return if symptoms worsen or fail to improve.  JRia Bush MD

## 2021-05-16 NOTE — Patient Instructions (Signed)
I think you may have rotator cuff irritation.  Do exercises provided, continue tylenol 1000mg  up to 3 times a day.  May try aleve 220mg  with dinner for the next 5-7 days. Caution these medicines are cleared by the kidneys and can increase bleeding risk so only use sparingly.  Let us know if not improving with this.

## 2021-05-16 NOTE — Assessment & Plan Note (Signed)
Bilateral shoulder pain that comes and goes. Exam today more consistent with RTC injury. Provided with exercises from patient advisor as well as resistance band. Discussed tylenol and ok to try sparing aleve use. Update if not improving with treatment. Pt agrees with plan.

## 2021-05-23 ENCOUNTER — Ambulatory Visit: Payer: Medicare PPO | Admitting: Family Medicine

## 2021-06-06 ENCOUNTER — Encounter: Payer: Self-pay | Admitting: Family Medicine

## 2021-06-06 DIAGNOSIS — M25512 Pain in left shoulder: Secondary | ICD-10-CM

## 2021-06-06 DIAGNOSIS — M25511 Pain in right shoulder: Secondary | ICD-10-CM

## 2021-06-09 NOTE — Telephone Encounter (Signed)
PT referral placed. Plz call to schedule xrays at location of his convenience.

## 2021-06-09 NOTE — Addendum Note (Signed)
Addended by: Ria Bush on: 06/09/2021 08:21 PM   Modules accepted: Orders

## 2021-06-11 ENCOUNTER — Encounter: Payer: Self-pay | Admitting: Family Medicine

## 2021-06-12 NOTE — Telephone Encounter (Signed)
I am not sure where all yall have been telling patients to go for xrays...  I believes its been [facility], [facility] and [place].   Mandy sent out a staff message with all the locations at one point. I dont deal with xrays so I am not 100% sure

## 2021-06-16 NOTE — Telephone Encounter (Signed)
Called and scheduled pt for tomorrow at 2:45 in [place]

## 2021-06-17 ENCOUNTER — Other Ambulatory Visit: Payer: Medicare (Managed Care)

## 2021-06-17 ENCOUNTER — Ambulatory Visit (INDEPENDENT_AMBULATORY_CARE_PROVIDER_SITE_OTHER): Payer: Medicare (Managed Care)

## 2021-06-17 ENCOUNTER — Other Ambulatory Visit: Payer: Self-pay

## 2021-06-17 DIAGNOSIS — M25512 Pain in left shoulder: Secondary | ICD-10-CM | POA: Diagnosis not present

## 2021-06-17 DIAGNOSIS — M25511 Pain in right shoulder: Secondary | ICD-10-CM

## 2021-06-17 NOTE — Telephone Encounter (Signed)
Pt called to say he had x-ray scheduled at [facility] at 1:00.

## 2021-06-20 ENCOUNTER — Encounter: Payer: Self-pay | Admitting: Family Medicine

## 2021-06-20 DIAGNOSIS — M25511 Pain in right shoulder: Secondary | ICD-10-CM

## 2021-06-20 DIAGNOSIS — R936 Abnormal findings on diagnostic imaging of limbs: Secondary | ICD-10-CM

## 2021-06-26 DIAGNOSIS — M25511 Pain in right shoulder: Secondary | ICD-10-CM | POA: Diagnosis not present

## 2021-06-26 DIAGNOSIS — M25512 Pain in left shoulder: Secondary | ICD-10-CM | POA: Diagnosis not present

## 2021-06-30 DIAGNOSIS — M25511 Pain in right shoulder: Secondary | ICD-10-CM | POA: Diagnosis not present

## 2021-06-30 DIAGNOSIS — M25512 Pain in left shoulder: Secondary | ICD-10-CM | POA: Diagnosis not present

## 2021-07-02 DIAGNOSIS — M25512 Pain in left shoulder: Secondary | ICD-10-CM | POA: Diagnosis not present

## 2021-07-02 DIAGNOSIS — M25511 Pain in right shoulder: Secondary | ICD-10-CM | POA: Diagnosis not present

## 2021-07-10 DIAGNOSIS — M25511 Pain in right shoulder: Secondary | ICD-10-CM | POA: Diagnosis not present

## 2021-07-10 DIAGNOSIS — M25512 Pain in left shoulder: Secondary | ICD-10-CM | POA: Diagnosis not present

## 2021-07-14 DIAGNOSIS — M25511 Pain in right shoulder: Secondary | ICD-10-CM | POA: Diagnosis not present

## 2021-07-14 DIAGNOSIS — M25512 Pain in left shoulder: Secondary | ICD-10-CM | POA: Diagnosis not present

## 2021-07-15 ENCOUNTER — Other Ambulatory Visit: Payer: Self-pay

## 2021-07-15 ENCOUNTER — Ambulatory Visit
Admission: RE | Admit: 2021-07-15 | Discharge: 2021-07-15 | Disposition: A | Discharge status: Home / Self Care | Payer: Medicare (Managed Care) | Source: Ambulatory Visit | Attending: Family Medicine | Admitting: Family Medicine

## 2021-07-15 DIAGNOSIS — M25512 Pain in left shoulder: Secondary | ICD-10-CM | POA: Diagnosis not present

## 2021-07-15 DIAGNOSIS — M25511 Pain in right shoulder: Secondary | ICD-10-CM | POA: Insufficient documentation

## 2021-07-15 DIAGNOSIS — R936 Abnormal findings on diagnostic imaging of limbs: Secondary | ICD-10-CM | POA: Insufficient documentation

## 2021-07-17 ENCOUNTER — Encounter: Payer: Self-pay | Admitting: Family Medicine

## 2021-07-17 DIAGNOSIS — M25511 Pain in right shoulder: Secondary | ICD-10-CM | POA: Diagnosis not present

## 2021-07-17 DIAGNOSIS — M25512 Pain in left shoulder: Secondary | ICD-10-CM | POA: Diagnosis not present

## 2021-07-20 ENCOUNTER — Encounter: Payer: Self-pay | Admitting: Family Medicine

## 2021-07-21 DIAGNOSIS — M25511 Pain in right shoulder: Secondary | ICD-10-CM | POA: Diagnosis not present

## 2021-07-21 DIAGNOSIS — M25512 Pain in left shoulder: Secondary | ICD-10-CM | POA: Diagnosis not present

## 2021-07-23 ENCOUNTER — Other Ambulatory Visit: Payer: Self-pay

## 2021-07-23 ENCOUNTER — Encounter: Payer: Self-pay | Admitting: Family Medicine

## 2021-07-23 ENCOUNTER — Ambulatory Visit (INDEPENDENT_AMBULATORY_CARE_PROVIDER_SITE_OTHER): Payer: Medicare (Managed Care) | Admitting: Family Medicine

## 2021-07-23 VITALS — BP 136/78 | HR 58 | Temp 97.5°F | Ht 69.5 in | Wt 180.2 lb

## 2021-07-23 DIAGNOSIS — M25512 Pain in left shoulder: Secondary | ICD-10-CM | POA: Diagnosis not present

## 2021-07-23 DIAGNOSIS — M19011 Primary osteoarthritis, right shoulder: Secondary | ICD-10-CM

## 2021-07-23 DIAGNOSIS — M25511 Pain in right shoulder: Secondary | ICD-10-CM | POA: Diagnosis not present

## 2021-07-23 DIAGNOSIS — M7531 Calcific tendinitis of right shoulder: Secondary | ICD-10-CM | POA: Diagnosis not present

## 2021-07-23 DIAGNOSIS — M19012 Primary osteoarthritis, left shoulder: Secondary | ICD-10-CM

## 2021-07-23 NOTE — Progress Notes (Signed)
Harry Fiebig T. Keidy Thurgood, MD, [place] at [facility] [place], [address]  Phone: [phone]   FAX: [place] - 88 y.o. male   MRN [id number]   Date of Birth: 1931/12/31  Date: 07/23/2021   PCP: Ria Bush, MD   Referral: Ria Bush, MD  Chief Complaint  Patient presents with   Shoulder Pain    C/o ongoing bilateral shoulder pain.  Started 3 mos ago. Denies injury.  Tried Aleve, Tylenol and PT.     This visit occurred during the SARS-CoV-2 public health emergency.  Safety protocols were in place, including screening questions prior to the visit, additional usage of staff PPE, and extensive cleaning of exam room while observing appropriate contact time as indicated for disinfecting solutions.   Subjective:   Harry Ray is a 88 y.o. very pleasant male patient with Body mass index is 26.24 kg/m. who presents with the following:  The patient presents in consultation from my partner Dr. Danise Mina, and he is here for evaluation of some bilateral shoulder pain.  Thus far, he has tried some Aleve.  I did independently reviewed the patient's bilateral shoulder films as well as the CT of the left shoulder.  Notable for acromioclavicular osteoarthritis, but no significant glenohumeral arthritis.  The Philip Aspen view on the right is suboptimal.  Back in November, right shoulder was hurting a lot.  Then realized his L shoulder was bothering him.   A couple of weeks ago, that evening both arms were hurting more.     ROM is better.   Sat and Sunday night, had the best  Pain at night.  Wakes him up.  25 years ago fractured  - vertebrae    Results for orders placed or performed in visit on 05/12/21  Lipid panel  Result Value Ref Range   Cholesterol 167 0 - 200 mg/dL   Triglycerides 114.0 0.0 - 149.0 mg/dL   HDL 48.70 >39.00 mg/dL   VLDL 22.8 0.0 - 40.0 mg/dL   LDL Cholesterol 96 0 - 99 mg/dL    Total CHOL/HDL Ratio 3    NonHDL 118.60   VITAMIN D 25 Hydroxy (Vit-D Deficiency, Fractures)  Result Value Ref Range   VITD 33.59 30.00 - 100.00 ng/mL  CBC with Differential/Platelet  Result Value Ref Range   WBC 6.6 4.0 - 10.5 K/uL   RBC 4.09 (L) 4.22 - 5.81 Mil/uL   Hemoglobin 13.0 13.0 - 17.0 g/dL   HCT 39.2 39.0 - 52.0 %   MCV 95.8 78.0 - 100.0 fl   MCHC 33.3 30.0 - 36.0 g/dL   RDW 14.0 11.5 - 15.5 %   Platelets 283.0 150.0 - 400.0 K/uL   Neutrophils Relative % 65.8 43.0 - 77.0 %   Lymphocytes Relative 19.3 12.0 - 46.0 %   Monocytes Relative 10.3 3.0 - 12.0 %   Eosinophils Relative 4.0 0.0 - 5.0 %   Basophils Relative 0.6 0.0 - 3.0 %   Neutro Abs 4.3 1.4 - 7.7 K/uL   Lymphs Abs 1.3 0.7 - 4.0 K/uL   Monocytes Absolute 0.7 0.1 - 1.0 K/uL   Eosinophils Absolute 0.3 0.0 - 0.7 K/uL   Basophils Absolute 0.0 0.0 - 0.1 K/uL  Sedimentation rate  Result Value Ref Range   Sed Rate 23 (H) 0 - 20 mm/hr  Rheumatoid factor  Result Value Ref Range   Rhuematoid fact SerPl-aCnc <14 <14 IU/mL  Uric acid  Result  Value Ref Range   Uric Acid, Serum 5.4 4.0 - 7.8 mg/dL     Review of Systems is noted in the HPI, as appropriate  Objective:   BP 136/78    Pulse (!) 58    Temp (!) 97.5 F (36.4 C) (Temporal)    Ht 5' 9.5" (1.765 m)    Wt 180 lb 4 oz (81.8 kg)    PF 99 L/min    BMI 26.24 kg/m   GEN: No acute distress; alert,appropriate. PULM: Breathing comfortably in no respiratory distress PSYCH: Normally interactive.    Shoulder: B Inspection: No muscle wasting or winging Ecchymosis/edema: neg  AC joint, scapula, clavicle: NT Cervical spine: NT, full ROM Spurling's: neg -He does have some pain with terminal motion in flexion and extension.  There is some modest loss of motion compared to expected bilaterally in abduction and flexion.  Strength is excellent. Abduction: full, 5/5 Flexion: full, 5/5 IR, full, lift-off: 5/5 ER at neutral: full, 5/5 AC crossover: Positive Neer:  pos Hawkins: Minimally positive Drop Test: neg Empty Can: Negative Supraspinatus insertion: Nontender Bicipital groove: NT Speed's: neg Yergason's: neg Sulcus sign: neg Scapular dyskinesis: none C5-T1 intact  Neuro: Sensation intact Grip 5/5   Laboratory and Imaging Data: CT SHOULDER LEFT WO CONTRAST  Result Date: 07/16/2021 CLINICAL DATA:  Bilateral shoulder pain.  No known injury. EXAM: CT OF THE UPPER LEFT EXTREMITY WITHOUT CONTRAST TECHNIQUE: Multidetector CT imaging of the upper left extremity was performed according to the standard protocol. RADIATION DOSE REDUCTION: This exam was performed according to the departmental dose-optimization program which includes automated exposure control, adjustment of the mA and/or kV according to patient size and/or use of iterative reconstruction technique. COMPARISON:  Plain films left shoulder 06/17/2021. FINDINGS: Bones/Joint/Cartilage No focal bone lesion is identified. In particular, no lesion is seen in the scapula as questioned on prior plain films. The appearance on the prior exam is likely due to overlapping shadows. There is no fracture or dislocation. Mild appearing acromioclavicular osteoarthritis is noted. Ligaments Suboptimally assessed on CT. Muscles and Tendons As visualized by CT scan, all imaged muscles appear normal. Soft tissues Imaged lung parenchyma is clear.  Soft tissues are negative. IMPRESSION: Negative for focal bone lesion. There is no evidence of neoplastic process on this exam. Mild acromioclavicular osteoarthritis. Electronically Signed   By: Inge Rise M.D.   On: 07/16/2021 10:08     Assessment and Plan:     ICD-10-CM   1. Acute pain of both shoulders  M25.511    M25.512     2. Bilateral acromioclavicular joint arthritis  M19.011    M19.012     3. Calcific tendinitis of right shoulder  M75.31      Total encounter time: 30 minutes. This includes total time spent on the day of encounter.  Additional time  spent in independent film reviewed by myself.  Additional chart review.  Patient is improving doing some physical therapy.  He is pretty happy with his progress.  I think that a lot of his pain is due to some AC joint significant arthritis.  He may have some glenohumeral arthritis, but this is certainly not impressive on his films.  My suspicion is that he has more arthritic change in the glenohumeral joint and you can appreciate.  He also does have some calcific tendinitis in the insertion of the supraspinatus on the right.  I am not sure how much this is contributing.  For now, I do not want to add  any medications at all and continue with some formal physical therapy.  No orders of the defined types were placed in this encounter.  There are no discontinued medications. No orders of the defined types were placed in this encounter.   Follow-up: 6 weeks  [employer] One speech-to-text software was used for transcription in this dictation.  Possible transcriptional errors can occur using Editor, commissioning.   Signed,  Maud Deed. Shifa Brisbon, MD   Outpatient Encounter Medications as of 07/23/2021  Medication Sig   cetirizine (ZYRTEC) 10 MG tablet Take 10 mg by mouth daily as needed for allergies. For seasonal allergies   Cholecalciferol (VITAMIN D3) 25 MCG (1000 UT) CAPS Take 1 capsule (1,000 Units total) by mouth daily.   finasteride (PROSCAR) 5 MG tablet TAKE 1 TABLET BY MOUTH EVERY DAY   fluticasone (FLONASE) 50 MCG/ACT nasal spray Place 2 sprays into both nostrils daily as needed for allergies or rhinitis. (Patient taking differently: Place 2 sprays into both nostrils daily as needed for allergies or rhinitis. 1 spray in each nostril daily)   Lifitegrast (XIIDRA) 5 % SOLN    Polyethyl Glycol-Propyl Glycol (SYSTANE OP) Apply to eye.   tamsulosin (FLOMAX) 0.4 MG CAPS capsule TAKE 1 CAPSULE(0.4 MG) BY MOUTH DAILY   No facility-administered encounter medications on file as of 07/23/2021.

## 2021-07-24 ENCOUNTER — Encounter: Payer: Self-pay | Admitting: Family Medicine

## 2021-07-24 DIAGNOSIS — M25512 Pain in left shoulder: Secondary | ICD-10-CM | POA: Diagnosis not present

## 2021-07-24 DIAGNOSIS — M25511 Pain in right shoulder: Secondary | ICD-10-CM | POA: Diagnosis not present

## 2021-07-30 DIAGNOSIS — M25512 Pain in left shoulder: Secondary | ICD-10-CM | POA: Diagnosis not present

## 2021-07-30 DIAGNOSIS — M25511 Pain in right shoulder: Secondary | ICD-10-CM | POA: Diagnosis not present

## 2021-08-01 DIAGNOSIS — M25512 Pain in left shoulder: Secondary | ICD-10-CM | POA: Diagnosis not present

## 2021-08-01 DIAGNOSIS — M25511 Pain in right shoulder: Secondary | ICD-10-CM | POA: Diagnosis not present

## 2021-08-04 DIAGNOSIS — M25512 Pain in left shoulder: Secondary | ICD-10-CM | POA: Diagnosis not present

## 2021-08-04 DIAGNOSIS — M25511 Pain in right shoulder: Secondary | ICD-10-CM | POA: Diagnosis not present

## 2021-08-07 DIAGNOSIS — M25511 Pain in right shoulder: Secondary | ICD-10-CM | POA: Diagnosis not present

## 2021-08-07 DIAGNOSIS — M25512 Pain in left shoulder: Secondary | ICD-10-CM | POA: Diagnosis not present

## 2021-08-12 DIAGNOSIS — M25512 Pain in left shoulder: Secondary | ICD-10-CM | POA: Diagnosis not present

## 2021-08-12 DIAGNOSIS — M25511 Pain in right shoulder: Secondary | ICD-10-CM | POA: Diagnosis not present

## 2021-08-14 DIAGNOSIS — M25512 Pain in left shoulder: Secondary | ICD-10-CM | POA: Diagnosis not present

## 2021-08-14 DIAGNOSIS — M25511 Pain in right shoulder: Secondary | ICD-10-CM | POA: Diagnosis not present

## 2021-08-18 DIAGNOSIS — M25512 Pain in left shoulder: Secondary | ICD-10-CM | POA: Diagnosis not present

## 2021-08-18 DIAGNOSIS — M25511 Pain in right shoulder: Secondary | ICD-10-CM | POA: Diagnosis not present

## 2021-08-21 DIAGNOSIS — M25512 Pain in left shoulder: Secondary | ICD-10-CM | POA: Diagnosis not present

## 2021-08-21 DIAGNOSIS — M25511 Pain in right shoulder: Secondary | ICD-10-CM | POA: Diagnosis not present

## 2021-08-31 ENCOUNTER — Encounter: Payer: Self-pay | Admitting: Family Medicine

## 2021-08-31 DIAGNOSIS — G8929 Other chronic pain: Secondary | ICD-10-CM

## 2021-09-01 NOTE — Telephone Encounter (Signed)
Pt c/o ongoing shoulder pain despite being seen by Dr. Lorelei Pont also.  ?

## 2021-09-03 ENCOUNTER — Ambulatory Visit (INDEPENDENT_AMBULATORY_CARE_PROVIDER_SITE_OTHER): Payer: Medicare (Managed Care) | Admitting: Family Medicine

## 2021-09-03 ENCOUNTER — Encounter: Payer: Self-pay | Admitting: Family Medicine

## 2021-09-03 VITALS — BP 134/80 | HR 81 | Temp 97.3°F | Ht 69.5 in | Wt 179.0 lb

## 2021-09-03 DIAGNOSIS — M25511 Pain in right shoulder: Secondary | ICD-10-CM

## 2021-09-03 DIAGNOSIS — G8929 Other chronic pain: Secondary | ICD-10-CM | POA: Diagnosis not present

## 2021-09-03 DIAGNOSIS — M2560 Stiffness of unspecified joint, not elsewhere classified: Secondary | ICD-10-CM

## 2021-09-03 DIAGNOSIS — M25512 Pain in left shoulder: Secondary | ICD-10-CM

## 2021-09-03 DIAGNOSIS — F8081 Childhood onset fluency disorder: Secondary | ICD-10-CM

## 2021-09-03 NOTE — Progress Notes (Signed)
? ? Patient ID: Harry Ray, male    DOB: 1932/05/20, 88 y.o.   MRN: [id number] ? ?This visit was conducted in person. ? ?BP 134/80   Pulse 81   Temp (!) 97.3 ?F (36.3 ?C) (Temporal)   Ht 5' 9.5" (1.765 m)   Wt 179 lb (81.2 kg)   SpO2 98%   BMI 26.05 kg/m?   ? ?CC: discuss speech ?Subjective:  ? ?HPI: ?Harry Ray is a 88 y.o. male presenting on 09/03/2021 for Speech Problem (C/o stammering when talking.  Started about 1 yr ago. ) and Shoulder Pain (C/o ongoing bilateral shoulder pain. ) ? ? ?Several months of speech difficulty described as stammering/stuttering which predominantly occurs mid-word. No memory or cognitive difficulty noted otherwise. Son has noticed this over the past year. This mainly occurs when having face-to-face conversations. No problem with reading materials. More trouble with talking. Denies tremor/shaking, denies stiffness. Notes balance has worsened. Continues walking 1 mi/day. No HA, vision changes, unilateral numbness/weakness.  ? ?Mother diagnosed with Pick's disease (FTD).  ? ?Ongoing bilateral R>L shoulder pain with radiation down anterior upper arms for past 4 months. Xrays showed bilateral AC and GH joint DJD and calcific tendonitis of R supraspinatus. L shoulder CT obtained for possible sclerotic lesion returned reassuring, with mild AC joint osteoarthritis.  ? ?He continues tylenol arthritis 664m and aleve 2256mintermittently. Has used up entire bottle of tylenol arthritis.  ?Underwent PT course Jan/Feb 2023 with some good improvement.  ? ?Saw Dr CoLorelei Pontast month - rec finish PT and f/u afterwards.  ?   ? ?Relevant past medical, surgical, family and social history reviewed and updated as indicated. Interim medical history since our last visit reviewed. ?Allergies and medications reviewed and updated. ?Outpatient Medications Prior to Visit  ?Medication Sig Dispense Refill  ? cetirizine (ZYRTEC) 10 MG tablet Take 10 mg by mouth daily as needed for allergies. For  seasonal allergies    ? Cholecalciferol (VITAMIN D3) 25 MCG (1000 UT) CAPS Take 1 capsule (1,000 Units total) by mouth daily. 30 capsule   ? finasteride (PROSCAR) 5 MG tablet TAKE 1 TABLET BY MOUTH EVERY DAY 90 tablet 3  ? fluticasone (FLONASE) 50 MCG/ACT nasal spray Place 2 sprays into both nostrils daily as needed for allergies or rhinitis. (Patient taking differently: Place 2 sprays into both nostrils daily as needed for allergies or rhinitis. 1 spray in each nostril daily) 16 g 6  ? Lifitegrast (XIIDRA) 5 % SOLN     ? Polyethyl Glycol-Propyl Glycol (SYSTANE OP) Apply to eye.    ? tamsulosin (FLOMAX) 0.4 MG CAPS capsule TAKE 1 CAPSULE(0.4 MG) BY MOUTH DAILY 90 capsule 3  ? ?No facility-administered medications prior to visit.  ?  ? ?Per HPI unless specifically indicated in ROS section below ?Review of Systems ? ?Objective:  ?BP 134/80   Pulse 81   Temp (!) 97.3 ?F (36.3 ?C) (Temporal)   Ht 5' 9.5" (1.765 m)   Wt 179 lb (81.2 kg)   SpO2 98%   BMI 26.05 kg/m?   ?Wt Readings from Last 3 Encounters:  ?09/03/21 179 lb (81.2 kg)  ?07/23/21 180 lb 4 oz (81.8 kg)  ?05/16/21 177 lb 8 oz (80.5 kg)  ?  ?  ?Physical Exam ?Vitals and nursing note reviewed.  ?Constitutional:   ?   Appearance: Normal appearance. He is not ill-appearing.  ?   Comments: Ambulates unassisted   ?HENT:  ?   Head: Normocephalic and atraumatic.  ?  Mouth/Throat:  ?   Mouth: Mucous membranes are moist.  ?   Pharynx: Oropharynx is clear. No oropharyngeal exudate or posterior oropharyngeal erythema.  ?Eyes:  ?   Extraocular Movements: Extraocular movements intact.  ?   Conjunctiva/sclera: Conjunctivae normal.  ?   Pupils: Pupils are equal, round, and reactive to light.  ?Cardiovascular:  ?   Rate and Rhythm: Normal rate and regular rhythm.  ?   Pulses: Normal pulses.  ?   Heart sounds: Normal heart sounds. No murmur heard. ?Pulmonary:  ?   Effort: Pulmonary effort is normal. No respiratory distress.  ?   Breath sounds: Normal breath sounds. No  wheezing, rhonchi or rales.  ?Musculoskeletal:  ?   Right lower leg: No edema.  ?   Left lower leg: No edema.  ?   Comments:  ?Bilateral shoulder exam: ?No deformity of shoulders on inspection. ?No pain with palpation of shoulder landmarks. ?FROM in abduction and forward flexion. Unable to effectively test passive ROM due to stiffness ?No pain or weakness with testing SITS in ext/int rotation. ?Discomfort with empty can sign. ?Neg Speed test. ?No impingement. ?No pain with rotation of humeral head in [facility] joint.   ?Skin: ?   General: Skin is warm and dry.  ?   Findings: No rash.  ?Neurological:  ?   Mental Status: He is alert.  ?   Cranial Nerves: Cranial nerves 2-12 are intact.  ?   Sensory: Sensation is intact.  ?   Motor: Abnormal muscle tone (?increased) present. No weakness or tremor.  ?   Comments:  ?CN 2-12 intact ?EOMI ?5/5 strength BUE, BLE ?Stiff movements throughout, unable to relax muscles for passing ROM testing or cogwheel testing ?No masked facies  ?No shuffling gait  ?Psychiatric:     ?   Mood and Affect: Mood normal.     ?   Behavior: Behavior normal.  ? ?   ?Results for orders placed or performed in visit on 05/12/21  ?Lipid panel  ?Result Value Ref Range  ? Cholesterol 167 0 - 200 mg/dL  ? Triglycerides 114.0 0.0 - 149.0 mg/dL  ? HDL 48.70 >39.00 mg/dL  ? VLDL 22.8 0.0 - 40.0 mg/dL  ? LDL Cholesterol 96 0 - 99 mg/dL  ? Total CHOL/HDL Ratio 3   ? NonHDL 118.60   ?VITAMIN D 25 Hydroxy (Vit-D Deficiency, Fractures)  ?Result Value Ref Range  ? VITD 33.59 30.00 - 100.00 ng/mL  ?CBC with Differential/Platelet  ?Result Value Ref Range  ? WBC 6.6 4.0 - 10.5 K/uL  ? RBC 4.09 (L) 4.22 - 5.81 Mil/uL  ? Hemoglobin 13.0 13.0 - 17.0 g/dL  ? HCT 39.2 39.0 - 52.0 %  ? MCV 95.8 78.0 - 100.0 fl  ? MCHC 33.3 30.0 - 36.0 g/dL  ? RDW 14.0 11.5 - 15.5 %  ? Platelets 283.0 150.0 - 400.0 K/uL  ? Neutrophils Relative % 65.8 43.0 - 77.0 %  ? Lymphocytes Relative 19.3 12.0 - 46.0 %  ? Monocytes Relative 10.3 3.0 - 12.0 %  ?  Eosinophils Relative 4.0 0.0 - 5.0 %  ? Basophils Relative 0.6 0.0 - 3.0 %  ? Neutro Abs 4.3 1.4 - 7.7 K/uL  ? Lymphs Abs 1.3 0.7 - 4.0 K/uL  ? Monocytes Absolute 0.7 0.1 - 1.0 K/uL  ? Eosinophils Absolute 0.3 0.0 - 0.7 K/uL  ? Basophils Absolute 0.0 0.0 - 0.1 K/uL  ?Sedimentation rate  ?Result Value Ref Range  ? Sed Rate 23 (  H) 0 - 20 mm/hr  ?Rheumatoid factor  ?Result Value Ref Range  ? Rhuematoid fact SerPl-aCnc <14 <14 IU/mL  ?Uric acid  ?Result Value Ref Range  ? Uric Acid, Serum 5.4 4.0 - 7.8 mg/dL  ? ? ?Assessment & Plan:  ? ?Problem List Items Addressed This Visit   ? ? Shoulder pain - Primary  ?  Ongoing bilateral shoulder discomfort despite OTC analgesics, PT course, heating pad. Anticipate osteoarthritis with RTC injury - specifically supraspinatus bilaterally. R shoulder xray previously showed calcific tendinosis of supraspinatus.  ?Recommend RTC injury exercises from [facility] pt advisor along with salon pas pads, to consider return to sports medicine to consider steroid injection. Pt agrees with plan.  ?Previously not consistent with PMR (ESR 23 05/2021), consider prednisone trial if not improving.  ?  ?  ? Stammering/stuttering  ?  Unclear cause. No significant speech impediment noted on exam today. However given this finding and pt's concern along with generalized stiffness noted on exam, did offer neurology evaluation - will refer to [facility] neurology.  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  ?  ?  ? Relevant Orders  ? Ambulatory referral to Neurology  ? ?Other Visit Diagnoses   ? ? Stiffness of multiple joints      ? Relevant Orders  ? Ambulatory referral to Neurology  ? ?  ?  ? ?No orders of the defined types were placed in this encounter. ? ?Orders Placed This Encounter  ?Procedures  ? Ambulatory referral to Neurology  ?  Referral Priority:   Routine  ?  Referral Type:   Consultation  ?  Referral Reason:   Specialty Services Required  ?  Requested Specialty:   Neurology  ?  Number of Visits Requested:    1  ? ? ? ?Patient Instructions  ?I think component of arthritis as well as possible rotator cuff irritation/injury.  ?Continue heating pad, consider salon pas patches, do exercises provided today.  ?If not improving, ret

## 2021-09-03 NOTE — Patient Instructions (Addendum)
I think component of arthritis as well as possible rotator cuff irritation/injury.  ?Continue heating pad, consider salon pas patches, do exercises provided today.  ?If not improving, return to see Dr Lorelei Pont to consider steroid injection.  ? ?We will monitor speech.  ?Continue 4 core lifestyle modifications to support a healthy mind:  ?1. Nutritious well balance diet.  ?2. Regular physical activity routine.  ?3. Regular mental activity such as reading books, word puzzles, math puzzles, jigsaw puzzles.  ?4. Social engagement.  ?

## 2021-09-04 ENCOUNTER — Encounter: Payer: Self-pay | Admitting: Family Medicine

## 2021-09-04 DIAGNOSIS — F8081 Childhood onset fluency disorder: Secondary | ICD-10-CM | POA: Insufficient documentation

## 2021-09-04 NOTE — Assessment & Plan Note (Signed)
Unclear cause. No significant speech impediment noted on exam today. However given this finding and pt's concern along with generalized stiffness noted on exam, did offer neurology evaluation - will refer to [facility] neurology.  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  ?

## 2021-09-04 NOTE — Assessment & Plan Note (Addendum)
Ongoing bilateral shoulder discomfort despite OTC analgesics, PT course, heating pad. Anticipate osteoarthritis with RTC injury - specifically supraspinatus bilaterally. R shoulder xray previously showed calcific tendinosis of supraspinatus.  ?Recommend RTC injury exercises from [facility] pt advisor along with salon pas pads, to consider return to sports medicine to consider steroid injection. Pt agrees with plan.  ?Previously not consistent with PMR (ESR 23 05/2021), consider prednisone trial if not improving.  ?

## 2021-09-18 ENCOUNTER — Encounter: Payer: Self-pay | Admitting: *Deleted

## 2021-09-19 ENCOUNTER — Telehealth: Payer: Self-pay

## 2021-09-19 MED ORDER — TAMSULOSIN HCL 0.4 MG PO CAPS: ORAL_CAPSULE | ORAL | 0 refills | Status: DC

## 2021-09-19 NOTE — Telephone Encounter (Signed)
E-scribed refill 

## 2021-09-22 ENCOUNTER — Other Ambulatory Visit: Payer: Self-pay

## 2021-09-22 NOTE — Telephone Encounter (Signed)
Patient called stating that he called [facility] neurology and they did not get referral from [place] yet. Can we get this re faxed please. Their fax number is [phone]. Thank you ? ?This has been re faxed ?

## 2021-09-23 NOTE — Addendum Note (Signed)
Addended by: Ria Bush on: 09/23/2021 07:47 AM ? ? Modules accepted: Orders ? ?

## 2021-09-23 NOTE — Telephone Encounter (Signed)
Replied via other mychart message.  

## 2021-09-25 ENCOUNTER — Encounter: Payer: Self-pay | Admitting: Family Medicine

## 2021-09-26 ENCOUNTER — Ambulatory Visit (INDEPENDENT_AMBULATORY_CARE_PROVIDER_SITE_OTHER): Payer: Medicare (Managed Care)

## 2021-09-26 VITALS — BP 133/72 | Ht 69.0 in | Wt 179.0 lb

## 2021-09-26 DIAGNOSIS — Z0000 Encounter for general adult medical examination without abnormal findings: Secondary | ICD-10-CM | POA: Diagnosis not present

## 2021-09-26 NOTE — Patient Instructions (Addendum)
?[name] , ?Thank you for taking time to come for your Medicare Wellness Visit. I appreciate your ongoing commitment to your health goals. Please review the following plan we discussed and let me know if I can assist you in the future.  ? ?These are the goals we discussed: ? Goals   ? ?   Increase physical activity (pt-stated)   ?   Patient Stated   ?   Starting 09/23/18, I will continue take medications as prescribed.  ? ?  ?   Patient Stated   ?   09/25/2019, I will continue walking everyday for about 2 miles. ?  ?   Patient Stated   ?   09/25/2020, I will continue to walk 2-3 days a week for 1 mile.  ?  ? ?  ?  ?This is a list of the screening recommended for you and due dates:  ?Health Maintenance  ?Topic Date Due  ? COVID-19 Vaccine (3 - Booster for Moderna series) 10/12/2021*  ? Zoster (Shingles) Vaccine (1 of 2) 12/26/2021*  ? Flu Shot  01/06/2022  ? Tetanus Vaccine  08/05/2024  ? Pneumonia Vaccine  Completed  ? HPV Vaccine  Aged Out  ?*Topic was postponed. The date shown is not the original due date.  ?  ?Advanced directives: Yes Patient will bring copy ? ?Conditions/risks identified: None ? ?Next appointment: Follow up in one year for your annual wellness visit.  ? ? ?Preventive Care 31 Years and Older, Male ?Preventive care refers to lifestyle choices and visits with your health care provider that can promote health and wellness. ?What does preventive care include? ?A yearly physical exam. This is also called an annual well check. ?Dental exams once or twice a year. ?Routine eye exams. Ask your health care provider how often you should have your eyes checked. ?Personal lifestyle choices, including: ?Daily care of your teeth and gums. ?Regular physical activity. ?Eating a healthy diet. ?Avoiding tobacco and drug use. ?Limiting alcohol use. ?Practicing safe sex. ?Taking low doses of aspirin every day. ?Taking vitamin and mineral supplements as recommended by your health care provider. ?What happens during an  annual well check? ?The services and screenings done by your health care provider during your annual well check will depend on your age, overall health, lifestyle risk factors, and family history of disease. ?Counseling  ?Your health care provider may ask you questions about your: ?Alcohol use. ?Tobacco use. ?Drug use. ?Emotional well-being. ?Home and relationship well-being. ?Sexual activity. ?Eating habits. ?History of falls. ?Memory and ability to understand (cognition). ?Work and work Statistician. ?Screening  ?You may have the following tests or measurements: ?Height, weight, and BMI. ?Blood pressure. ?Lipid and cholesterol levels. These may be checked every 5 years, or more frequently if you are over 3 years old. ?Skin check. ?Lung cancer screening. You may have this screening every year starting at age 80 if you have a 30-pack-year history of smoking and currently smoke or have quit within the past 15 years. ?Fecal occult blood test (FOBT) of the stool. You may have this test every year starting at age 50. ?Flexible sigmoidoscopy or colonoscopy. You may have a sigmoidoscopy every 5 years or a colonoscopy every 10 years starting at age 63. ?Prostate cancer screening. Recommendations will vary depending on your family history and other risks. ?Hepatitis C blood test. ?Hepatitis B blood test. ?Sexually transmitted disease (STD) testing. ?Diabetes screening. This is done by checking your blood sugar (glucose) after you have not eaten for a  while (fasting). You may have this done every 1-3 years. ?Abdominal aortic aneurysm (AAA) screening. You may need this if you are a current or former smoker. ?Osteoporosis. You may be screened starting at age 28 if you are at high risk. ?Talk with your health care provider about your test results, treatment options, and if necessary, the need for more tests. ?Vaccines  ?Your health care provider may recommend certain vaccines, such as: ?Influenza vaccine. This is recommended  every year. ?Tetanus, diphtheria, and acellular pertussis (Tdap, Td) vaccine. You may need a Td booster every 10 years. ?Zoster vaccine. You may need this after age 73. ?Pneumococcal 13-valent conjugate (PCV13) vaccine. One dose is recommended after age 22. ?Pneumococcal polysaccharide (PPSV23) vaccine. One dose is recommended after age 94. ?Talk to your health care provider about which screenings and vaccines you need and how often you need them. ?This information is not intended to replace advice given to you by your health care provider. Make sure you discuss any questions you have with your health care provider. ?Document Released: 06/21/2015 Document Revised: 02/12/2016 Document Reviewed: 03/26/2015 ?Elsevier Interactive Patient Education ? 2017 [place]. ? ?Fall Prevention in the Home ?Falls can cause injuries. They can happen to people of all ages. There are many things you can do to make your home safe and to help prevent falls. ?What can I do on the outside of my home? ?Regularly fix the edges of walkways and driveways and fix any cracks. ?Remove anything that might make you trip as you walk through a door, such as a raised step or threshold. ?Trim any bushes or trees on the path to your home. ?Use bright outdoor lighting. ?Clear any walking paths of anything that might make someone trip, such as rocks or tools. ?Regularly check to see if handrails are loose or broken. Make sure that both sides of any steps have handrails. ?Any raised decks and porches should have guardrails on the edges. ?Have any leaves, snow, or ice cleared regularly. ?Use sand or salt on walking paths during winter. ?Clean up any spills in your garage right away. This includes oil or grease spills. ?What can I do in the bathroom? ?Use night lights. ?Install grab bars by the toilet and in the tub and shower. Do not use towel bars as grab bars. ?Use non-skid mats or decals in the tub or shower. ?If you need to sit down in the shower,  use a plastic, non-slip stool. ?Keep the floor dry. Clean up any water that spills on the floor as soon as it happens. ?Remove soap buildup in the tub or shower regularly. ?Attach bath mats securely with double-sided non-slip rug tape. ?Do not have throw rugs and other things on the floor that can make you trip. ?What can I do in the bedroom? ?Use night lights. ?Make sure that you have a light by your bed that is easy to reach. ?Do not use any sheets or blankets that are too big for your bed. They should not hang down onto the floor. ?Have a firm chair that has side arms. You can use this for support while you get dressed. ?Do not have throw rugs and other things on the floor that can make you trip. ?What can I do in the kitchen? ?Clean up any spills right away. ?Avoid walking on wet floors. ?Keep items that you use a lot in easy-to-reach places. ?If you need to reach something above you, use a strong step stool that has a grab  bar. ?Keep electrical cords out of the way. ?Do not use floor polish or wax that makes floors slippery. If you must use wax, use non-skid floor wax. ?Do not have throw rugs and other things on the floor that can make you trip. ?What can I do with my stairs? ?Do not leave any items on the stairs. ?Make sure that there are handrails on both sides of the stairs and use them. Fix handrails that are broken or loose. Make sure that handrails are as long as the stairways. ?Check any carpeting to make sure that it is firmly attached to the stairs. Fix any carpet that is loose or worn. ?Avoid having throw rugs at the top or bottom of the stairs. If you do have throw rugs, attach them to the floor with carpet tape. ?Make sure that you have a light switch at the top of the stairs and the bottom of the stairs. If you do not have them, ask someone to add them for you. ?What else can I do to help prevent falls? ?Wear shoes that: ?Do not have high heels. ?Have rubber bottoms. ?Are comfortable and fit you  well. ?Are closed at the toe. Do not wear sandals. ?If you use a stepladder: ?Make sure that it is fully opened. Do not climb a closed stepladder. ?Make sure that both sides of the stepladder are locked

## 2021-09-26 NOTE — Progress Notes (Signed)
? ?Subjective:  ? Harry Ray is a 88 y.o. male who presents for Medicare Annual/Subsequent preventive examination. ? ?Review of Systems    ?Virtual Visit via Telephone Note ? ?I connected with  Harry Ray on 09/26/21 at  9:00 AM EDT by telephone and verified that I am speaking with the correct person using two identifiers. ? ?Location: ?Patient: Home ?Provider: Office ?Persons participating in the virtual visit: patient/Nurse Health Advisor ?  ?I discussed the limitations, risks, security and privacy concerns of performing an evaluation and management service by telephone and the availability of in person appointments. The patient expressed understanding and agreed to proceed. ? ?Interactive audio and video telecommunications were attempted between this nurse and patient, however failed, due to patient having technical difficulties OR patient did not have access to video capability.  We continued and completed visit with audio only. ? ?Some vital signs may be absent or patient reported.  ? ?Harry Peaches, LPN  ?Cardiac Risk Factors include: advanced age (>11mn, >>6women) ? ?   ?Objective:  ?  ?Today's Vitals  ? 09/26/21 0901  ?BP: 133/72  ?Weight: 179 lb (81.2 kg)  ?Height: '5\' 9"'$  (1.753 m)  ? ?Body mass index is 26.43 kg/m?. ? ? ?  09/26/2021  ?  9:13 AM 09/25/2020  ?  8:51 AM 09/25/2019  ?  9:33 AM 09/23/2018  ?  8:28 AM 09/09/2017  ?  8:16 AM  ?Advanced Directives  ?Does Patient Have a Medical Advance Directive? Yes Yes Yes Yes Yes  ?Type of AParamedicof [place]Living will [place]Living will [place]Living will [place]Living will [place] HillsLiving will  ?Does patient want to make changes to medical advance directive? No - Patient declined      ?Copy of [place] Chart? No - copy requested No - copy requested Yes - validated most recent copy scanned in chart (See row  information) Yes - validated most recent copy scanned in chart (See row information) Yes  ?Would patient like information on creating a medical advance directive?    No - Patient declined   ? ? ?Current Medications (verified) ?Outpatient Encounter Medications as of 09/26/2021  ?Medication Sig  ? cetirizine (ZYRTEC) 10 MG tablet Take 10 mg by mouth daily as needed for allergies. For seasonal allergies  ? Cholecalciferol (VITAMIN D3) 25 MCG (1000 UT) CAPS Take 1 capsule (1,000 Units total) by mouth daily.  ? finasteride (PROSCAR) 5 MG tablet TAKE 1 TABLET BY MOUTH EVERY DAY  ? fluticasone (FLONASE) 50 MCG/ACT nasal spray Place 2 sprays into both nostrils daily as needed for allergies or rhinitis. (Patient taking differently: Place 2 sprays into both nostrils daily as needed for allergies or rhinitis. 1 spray in each nostril daily)  ? Lifitegrast (XIIDRA) 5 % SOLN   ? Polyethyl Glycol-Propyl Glycol (SYSTANE OP) Apply to eye.  ? tamsulosin (FLOMAX) 0.4 MG CAPS capsule TAKE 1 CAPSULE(0.4 MG) BY MOUTH DAILY  ? ?No facility-administered encounter medications on file as of 09/26/2021.  ? ? ?Allergies (verified) ?Patient has no known allergies.  ? ?History: ?Past Medical History:  ?Diagnosis Date  ? Arthritis   ? pinkys bilaterally and left thumb  ? Carotid stenosis 10/07/2016  ? LICA 466-06% RICA 10-04% rpt 1 yr (09/2016)  ? Heart murmur   ? minor  ? History of asthma 1983  ? asthma attack as reaction to allergy medication (2d ICU stay)  ?  History of chicken pox   ? Nephritis childhood  ? Seasonal allergies   ? spring  ? Urine incontinence   ? ?Past Surgical History:  ?Procedure Laterality Date  ? COLONOSCOPY  08/07/2005  ? Diverticulosis (Colon in [place])  ? SCROTAL SURGERY    ? ? benign cyst removal  ? ?Family History  ?Problem Relation Age of Onset  ? Dementia Mother   ?     Pick's disease  ? Cancer Maternal Grandfather   ?     stomach  ? Cancer Paternal Uncle   ?     throat - smoker  ? Diabetes Neg Hx   ? CAD Neg Hx    ? ?Social History  ? ?Socioeconomic History  ? Marital status: Widowed  ?  Spouse name: Not on file  ? Number of children: Not on file  ? Years of education: Not on file  ? Highest education level: Not on file  ?Occupational History  ? Not on file  ?Tobacco Use  ? Smoking status: Never  ? Smokeless tobacco: Never  ?Vaping Use  ? Vaping Use: Never used  ?Substance and Sexual Activity  ? Alcohol use: No  ?  Alcohol/week: 0.0 standard drinks  ? Drug use: No  ? Sexual activity: Not on file  ?Other Topics Concern  ? Not on file  ?Social History Narrative  ? Widower, lives at [facility], no pets   ? Was married for 36 yrs   ? Wife suddenly passed away during a surgery 03-10-2019   ? Son and family live in [place]   ? Occ: retired, was [employer]  ? Activity: starting [place] exercise program 2x/wk, enjoys walking  ? ?Social Determinants of Health  ? ?Financial Resource Strain: Low Risk   ? Difficulty of Paying Living Expenses: Not hard at all  ?Food Insecurity: No Food Insecurity  ? Worried About Charity fundraiser in the Last Year: Never true  ? Ran Out of Food in the Last Year: Never true  ?Transportation Needs: No Transportation Needs  ? Lack of Transportation (Medical): No  ? Lack of Transportation (Non-Medical): No  ?Physical Activity: Sufficiently Active  ? Days of Exercise per Week: 5 days  ? Minutes of Exercise per Session: 40 min  ?Stress: No Stress Concern Present  ? Feeling of Stress : Only a little  ?Social Connections: Moderately Integrated  ? Frequency of Communication with Friends and Family: More than three times a week  ? Frequency of Social Gatherings with Friends and Family: More than three times a week  ? Attends Religious Services: More than 4 times per year  ? Active Member of Clubs or Organizations: Yes  ? Attends Archivist Meetings: More than 4 times per year  ? Marital Status: Widowed  ? ? ? ?Clinical Intake: ? ? ?Diabetic?  No ? ?Interpreter Needed?: No ?Activities  of Daily Living ? ?  09/26/2021  ?  9:11 AM  ?In your present state of health, do you have any difficulty performing the following activities:  ?Hearing? 0  ?Vision? 0  ?Difficulty concentrating or making decisions? 0  ?Walking or climbing stairs? 0  ?Dressing or bathing? 0  ?Doing errands, shopping? 0  ?Preparing Food and eating ? N  ?Using the Toilet? N  ?In the past six months, have you accidently leaked urine? N  ?Do you have problems with loss of bowel control? N  ?Managing your Medications? N  ?Managing your Finances? N  ?  Housekeeping or managing your Housekeeping? N  ? ? ?Patient Care Team: ?Ria Bush, MD as PCP - General (Family Medicine) ? ?Indicate any recent Medical Services you may have received from other than [facility] providers in the past year (date may be approximate). ? ?   ?Assessment:  ? This is a routine wellness examination for [place]. ? ?Hearing/Vision screen ?Hearing Screening - Comments:: No difficulty hearing ?Vision Screening - Comments:: Wears glasses. Followed by [facility] ? ?Dietary issues and exercise activities discussed: ?Exercise limited by: None identified ? ? Goals Addressed   ? ?  ?  ?  ?  ?  ? This Visit's Progress  ?   Increase physical activity (pt-stated)     ? ?  ? ?Depression Screen ? ?  09/26/2021  ?  9:05 AM 09/25/2020  ?  8:53 AM 09/25/2019  ?  9:34 AM 09/23/2018  ?  8:27 AM 09/09/2017  ?  8:15 AM 08/25/2016  ?  3:55 PM  ?PHQ 2/9 Scores  ?PHQ - 2 Score 0 2 2 0 0 0  ?PHQ- 9 Score  2 2 0 0   ?  ?Fall Risk ? ?  09/26/2021  ?  9:12 AM 09/25/2020  ?  8:52 AM 09/25/2019  ?  9:33 AM 09/23/2018  ?  8:27 AM 09/09/2017  ?  8:15 AM  ?Fall Risk   ?Falls in the past year? 0 0 0 0 No  ?Number falls in past yr: 0 0 0    ?Injury with Fall? 0 0 0    ?Risk for fall due to : No Fall Risks Impaired balance/gait;Medication side effect No Fall Risks    ?Follow up  Falls evaluation completed;Falls prevention discussed Falls evaluation completed;Falls prevention discussed    ? ? ?FALL RISK  PREVENTION PERTAINING TO THE HOME: ? ?Any stairs in or around the home? Yes  ?If so, are there any without handrails? No  ?Home free of loose throw rugs in walkways, pet beds, electrical cords, etc? Yes  ?Adequate li

## 2021-09-30 MED ORDER — PREDNISONE 20 MG PO TABS: ORAL_TABLET | ORAL | 0 refills | Status: DC

## 2021-09-30 NOTE — Telephone Encounter (Signed)
Can we fax PT referral to [facility] PT. Nurses Fax # [phone], [place] RN. ? ?Ashtyn sent it last week but pt states it was never received.  ?

## 2021-10-06 ENCOUNTER — Encounter: Payer: Self-pay | Admitting: Family Medicine

## 2021-11-09 ENCOUNTER — Encounter: Payer: Self-pay | Admitting: Family Medicine

## 2021-11-10 MED ORDER — PREDNISONE 20 MG PO TABS: 20.0000 mg | ORAL_TABLET | Freq: Every day | ORAL | 0 refills | Status: DC

## 2021-11-10 MED ORDER — PREDNISONE 5 MG PO TABS: 15.0000 mg | ORAL_TABLET | Freq: Every day | ORAL | 0 refills | Status: DC

## 2021-11-10 NOTE — Telephone Encounter (Signed)
Spoke with pt scheduling OV for 6 wk prednisone f/u on 12/22/21 at 8:00.

## 2021-11-10 NOTE — Telephone Encounter (Signed)
Spoke with pt about MyChart message.  States there was a mix-up at the pharmacy with the prednisone rxs but it has since been straightened out.  Nothing else needed at this time.

## 2021-12-22 ENCOUNTER — Encounter: Payer: Self-pay | Admitting: Family Medicine

## 2021-12-22 ENCOUNTER — Ambulatory Visit (INDEPENDENT_AMBULATORY_CARE_PROVIDER_SITE_OTHER): Payer: Medicare (Managed Care) | Admitting: Family Medicine

## 2021-12-22 VITALS — BP 140/70 | HR 103 | Temp 98.2°F | Ht 69.0 in | Wt 179.0 lb

## 2021-12-22 DIAGNOSIS — M353 Polymyalgia rheumatica: Secondary | ICD-10-CM

## 2021-12-22 DIAGNOSIS — F8081 Childhood onset fluency disorder: Secondary | ICD-10-CM

## 2021-12-22 DIAGNOSIS — M85851 Other specified disorders of bone density and structure, right thigh: Secondary | ICD-10-CM

## 2021-12-22 DIAGNOSIS — R011 Cardiac murmur, unspecified: Secondary | ICD-10-CM

## 2021-12-22 LAB — SEDIMENTATION RATE: Sed Rate: 11 mm/hr (ref 0–20)

## 2021-12-22 LAB — C-REACTIVE PROTEIN: CRP: <1 mg/dL (ref 0.5–20.0)

## 2021-12-22 NOTE — Assessment & Plan Note (Addendum)
ESR at beginning of symptoms was normal. However marked improvement on prednisone points towards polymyalgia rheumatica. Again discussed this with patient as well as idea of prolonged prednisone taper.  He is currently on 15 mg dose, will drop to 10 mg dose next month and drop by 1 mg monthly subsequently.  He will let me know if symptoms recur as we continue to taper. Patient agrees with plan. Update inflammatory markers today.

## 2021-12-22 NOTE — Progress Notes (Addendum)
Patient ID: Harry Ray, male    DOB: 1932-06-05, 88 y.o.   MRN: [id number]  This visit was conducted in person.  BP 140/70   Pulse (!) 103   Temp 98.2 F (36.8 C) (Temporal)   Ht 5' 9"  (1.753 m)   Wt 179 lb (81.2 kg)   SpO2 93%   BMI 26.43 kg/m    CC: PMR f/u Subjective:   HPI: Harry Ray is a 88 y.o. male presenting on 12/22/2021 for Follow-up (Here for prednisone f/u.)   See recent OV and mychart messages.  5+ months of bilateral shoulder pain radiating down arms, without hip girdle pain. ESR was not elevated 05/2021.  Prednisone started at 20 mg daily at the beginning of June 2023 - with marked relief pointing towards PMR. Currently on prednisone 51m daily.   Stuttering/stammering - has been referred to neurology, appt tomorrow. Hasn't noted any more speech difficulty since starting prednisone.   Tried acupuncture without much benefit.      Relevant past medical, surgical, family and social history reviewed and updated as indicated. Interim medical history since our last visit reviewed. Allergies and medications reviewed and updated. Outpatient Medications Prior to Visit  Medication Sig Dispense Refill   cetirizine (ZYRTEC) 10 MG tablet Take 10 mg by mouth daily as needed for allergies. For seasonal allergies     Cholecalciferol (VITAMIN D3) 25 MCG (1000 UT) CAPS Take 1 capsule (1,000 Units total) by mouth daily. 30 capsule    finasteride (PROSCAR) 5 MG tablet TAKE 1 TABLET BY MOUTH EVERY DAY 90 tablet 3   fluticasone (FLONASE) 50 MCG/ACT nasal spray Place 2 sprays into both nostrils daily as needed for allergies or rhinitis. (Patient taking differently: Place 2 sprays into both nostrils daily as needed for allergies or rhinitis. 1 spray in each nostril daily) 16 g 6   Lifitegrast (XIIDRA) 5 % SOLN      Polyethyl Glycol-Propyl Glycol (SYSTANE OP) Apply to eye.     tamsulosin (FLOMAX) 0.4 MG CAPS capsule TAKE 1 CAPSULE(0.4 MG) BY MOUTH DAILY 90 capsule 0    predniSONE (DELTASONE) 20 MG tablet Take 1 tablet (20 mg total) by mouth daily with breakfast. For the first month 30 tablet 0   predniSONE (DELTASONE) 5 MG tablet Take 3 tablets (15 mg total) by mouth daily with breakfast. For the second month 90 tablet 0   No facility-administered medications prior to visit.     Per HPI unless specifically indicated in ROS section below Review of Systems  Objective:  BP 140/70   Pulse (!) 103   Temp 98.2 F (36.8 C) (Temporal)   Ht 5' 9"  (1.753 m)   Wt 179 lb (81.2 kg)   SpO2 93%   BMI 26.43 kg/m   Wt Readings from Last 3 Encounters:  12/22/21 179 lb (81.2 kg)  09/26/21 179 lb (81.2 kg)  09/03/21 179 lb (81.2 kg)      Physical Exam Vitals and nursing note reviewed.  Constitutional:      Appearance: Normal appearance. He is not ill-appearing.  Cardiovascular:     Rate and Rhythm: Normal rate and regular rhythm.     Pulses: Normal pulses.     Heart sounds: Murmur (3/6 systolic) heard.  Pulmonary:     Effort: Pulmonary effort is normal. No respiratory distress.     Breath sounds: Normal breath sounds. No wheezing, rhonchi or rales.  Musculoskeletal:        General: No tenderness.  Right lower leg: No edema.     Left lower leg: No edema.     Comments: No shoulder pain to palpation, FROM  Skin:    General: Skin is warm and dry.     Findings: No rash.  Neurological:     Mental Status: He is alert.  Psychiatric:        Mood and Affect: Mood normal.        Behavior: Behavior normal.       Results for orders placed or performed in visit on 12/22/21  Sedimentation rate  Result Value Ref Range   Sed Rate 11 0 - 20 mm/hr  C-reactive protein  Result Value Ref Range   CRP <1.0 0.5 - 20.0 mg/dL    Assessment & Plan:   Problem List Items Addressed This Visit     Systolic murmur    This persists      Osteopenia    He is now on prolonged prednisone taper for PMR. Discussed importance of good calcium and vitamin D  intake. Consider updated DEXA June 2024.      PMR (polymyalgia rheumatica) (HCC) - Primary    ESR at beginning of symptoms was normal. However marked improvement on prednisone points towards polymyalgia rheumatica. Again discussed this with patient as well as idea of prolonged prednisone taper.  He is currently on 15 mg dose, will drop to 10 mg dose next month and drop by 1 mg monthly subsequently.  He will let me know if symptoms recur as we continue to taper. Patient agrees with plan. Update inflammatory markers today.      Relevant Orders   Sedimentation rate (Completed)   C-reactive protein (Completed)   Stammering/stuttering    Upcoming your appointment tomorrow, surprisingly these symptoms have largely resolved since he has been on prednisone.        Meds ordered this encounter  Medications   predniSONE (DELTASONE) 5 MG tablet    Sig: Take 2 tablets (10 mg total) by mouth daily with breakfast for 30 days, THEN 1 tablet (5 mg total) daily with breakfast. With 44m dose for 97mthe next month.    Dispense:  90 tablet    Refill:  0   predniSONE (DELTASONE) 1 MG tablet    Sig: Take 1 tablet (1 mg total) by mouth daily with breakfast. With 52m62mose - for 9mg59men due    Dispense:  120 tablet    Refill:  0   Orders Placed This Encounter  Procedures   Sedimentation rate   C-reactive protein    Patient instructions: Continue prednisone taper - finish 152mg752mth, then start 10mg 78mh then taper by 1 mg per month until off prednisone. Let me know if any recurrent shoulder pain symptoms as we do taper.  Return in 2-3 months for physical.  Labs today Good to see you today  Follow up plan: Return in about 3 months (around 03/24/2022), or if symptoms worsen or fail to improve, for annual exam, prior fasting for blood work.  JavierRia Bush

## 2021-12-22 NOTE — Assessment & Plan Note (Signed)
Upcoming your appointment tomorrow, surprisingly these symptoms have largely resolved since he has been on prednisone.

## 2021-12-22 NOTE — Addendum Note (Signed)
Addended by: Ria Bush on: 12/22/2021 08:31 AM   Modules accepted: Level of Service

## 2021-12-22 NOTE — Assessment & Plan Note (Signed)
He is now on prolonged prednisone taper for PMR. Discussed importance of good calcium and vitamin D intake. Consider updated DEXA June 2024.

## 2021-12-22 NOTE — Patient Instructions (Addendum)
Continue prednisone taper - finish '15mg'$  month, then start '10mg'$  month then taper by 1 mg per month until off prednisone. Let me know if any recurrent shoulder pain symptoms as we do taper.  Return in 2-3 months for physical.  Labs today Good to see you today  Polymyalgia Rheumatica Polymyalgia rheumatica (PMR) is an inflammatory disorder that causes the muscles and joints to ache and become stiff. Sometimes, PMR leads to a more dangerous condition that can cause vision loss (temporal arteritis or giant cell arteritis). What are the causes? The exact cause of PMR is not known. What increases the risk? You are more likely to develop this condition if you are: Male. 86 years old or older. Of [place] descent. What are the signs or symptoms? Pain and stiffness are the main symptoms of PMR and affect both sides of the body. These symptoms: May be worse after inactivity and in the morning. May affect your: Hips, buttocks, and thighs. Neck, arms, and shoulders. This can make it hard to raise your arms above your head. Hands and wrists. Other symptoms include: Fever. Tiredness. Weakness. Depression. Decreased appetite. This may lead to weight loss. Symptoms could start slowly but often come on suddenly, sometimes even overnight, or over a few days. How is this diagnosed? This condition is diagnosed with your medical history and a physical exam. You may need to see a health care provider who specializes in diseases of the joints, muscles, and bones (rheumatologist). You may also have tests, including: Blood tests. Often, test results that show inflammation are very elevated. Imaging studies like X-rays or ultrasound, which may be done to rule out other conditions. These are often usual in PMR. How is this treated? PMR usually goes away without treatment, but it may take years. PMR often responds rapidly (within a few days) to low-dose glucocorticoids (steroids). These medicines  have serious side effects. Once your symptoms are better controlled, the dose should be lowered to find the lowest possible dose that controls your symptoms. Regular exercise and rest will also help your symptoms. Follow these instructions at home:  Take over-the-counter and prescription medicines only as told by your health care provider. Make sure to get enough rest and sleep. Eat a healthy and nutritious diet that includes calcium and vitamin D. Calcium is important for bone health, and vitamin D helps your body absorb calcium. Good sources of calcium and vitamin D include: Certain fatty fish, such as salmon and tuna. Products that have calcium and vitamin D added to them (are fortified), such as fortified cereals. Collard greens, turnip greens, broccoli, and kale. Egg yolks. Cheese. Liver. Try to exercise most days of the week. Ask your health care provider what type of exercises are best for you. Keep all follow-up visits. This is important. Contact a health care provider if: Your symptoms do not improve with medicine. You have side effects from steroids. These may include: Weight gain. Swelling. Insomnia. Mood changes. Bruising. High blood sugar readings, if you have diabetes. Higher than normal blood pressure readings, if you monitor your blood pressure. Get help right away if: You develop symptoms of temporal arteritis, such as: A change in vision. Severe headache. Scalp pain. Jaw pain. These symptoms may represent a serious problem that is an emergency. Do not wait to see if the symptoms will go away. Get medical help right away. Call your local emergency services (911 in the [place] Do not drive yourself to the hospital. Summary Polymyalgia rheumatica is an inflammatory  disorder that causes aching and stiffness in your muscles and joints. The exact cause of this condition is not known. This condition usually goes away without treatment. Your health care provider may give  you low-dose glucocorticoids (steroids) to help manage your pain and stiffness. Rest and regular exercise will help the symptoms. This information is not intended to replace advice given to you by your health care provider. Make sure you discuss any questions you have with your health care provider. Document Revised: 09/26/2020 Document Reviewed: 09/26/2020 Elsevier Patient Education  [place] [place].

## 2021-12-22 NOTE — Assessment & Plan Note (Signed)
This persists

## 2021-12-23 MED ORDER — PREDNISONE 1 MG PO TABS: 1.0000 mg | ORAL_TABLET | Freq: Every day | ORAL | 0 refills | Status: DC

## 2021-12-23 MED ORDER — PREDNISONE 5 MG PO TABS: ORAL_TABLET | ORAL | 0 refills | Status: DC

## 2021-12-23 NOTE — Addendum Note (Signed)
Addended by: Ria Bush on: 12/23/2021 07:58 AM   Modules accepted: Orders

## 2021-12-24 ENCOUNTER — Other Ambulatory Visit: Payer: Self-pay | Admitting: Neurology

## 2021-12-24 DIAGNOSIS — F800 Phonological disorder: Secondary | ICD-10-CM

## 2021-12-29 ENCOUNTER — Other Ambulatory Visit: Payer: Self-pay | Admitting: Family Medicine

## 2021-12-30 NOTE — Telephone Encounter (Signed)
Patient has been scheduled

## 2021-12-30 NOTE — Telephone Encounter (Signed)
Noted  

## 2021-12-30 NOTE — Telephone Encounter (Signed)
E-scribed refill.  Pt had [facility] wellness on 09/26/21.  Plz schedule CPE and lab visits for additional refills.

## 2021-12-31 ENCOUNTER — Ambulatory Visit
Admission: RE | Admit: 2021-12-31 | Discharge: 2021-12-31 | Disposition: A | Discharge status: Home / Self Care | Payer: Medicare (Managed Care) | Source: Ambulatory Visit | Attending: Neurology | Admitting: Neurology

## 2021-12-31 DIAGNOSIS — F800 Phonological disorder: Secondary | ICD-10-CM | POA: Insufficient documentation

## 2022-01-09 ENCOUNTER — Telehealth: Payer: Self-pay | Admitting: Family Medicine

## 2022-01-09 NOTE — Telephone Encounter (Signed)
Spoke with pt about prednisone instructions.  Says he will start 10 mg daily tomorrow for 30 days.  But then he's confused about the next set of instructions on 5 mg bottle which state:   THEN 1 tablet (5 mg total) daily with breakfast. With '1mg'$  dose for '9mg'$  the next month.  He's asking if this should be 6 mg or is he to take 4 1 mg tabs plus the 5 mg to equal 9 mg.

## 2022-01-09 NOTE — Telephone Encounter (Signed)
Patient called in with questions regarding his predniSONE (DELTASONE) 1 MG tablet stated that he had a little confusion about it stating '9mg'$  when due. Please advise. Thank you!

## 2022-01-12 ENCOUNTER — Telehealth: Payer: Self-pay | Admitting: Family Medicine

## 2022-01-12 MED ORDER — PREDNISONE 1 MG PO TABS: 4.0000 mg | ORAL_TABLET | Freq: Every day | ORAL | 0 refills | Status: DC

## 2022-01-12 NOTE — Telephone Encounter (Signed)
Spoke with pt relaying Dr. Synthia Innocent message.  Pt verbalizes understanding about the 10 mg and is currently taking 2 5 mg tablets daily.  However, he is still confused about next month's dose when Dr. Darnell Level says to take 9 mg daily for 1 mo.  Says the directions on the 1 mg bottle instruct pt to take 1- 1 mg tablet and a 5 mg tablet for 9 mg daily.  Pt says to him 1- 1 mg tab + 1- 5 mg tab equals 6 mg total a day, not 9 mg.  Pt requests a call from Dr. Darnell Level to clarify.

## 2022-01-12 NOTE — Telephone Encounter (Signed)
See other phone note

## 2022-01-12 NOTE — Telephone Encounter (Addendum)
Left message on voicemail for patient to call the office back. 

## 2022-01-12 NOTE — Telephone Encounter (Addendum)
Left message. I have sent new '1mg'$  instructions to pharmacy. He should take 4 tablets ('4mg'$ ) with '5mg'$  tablet for total of '9mg'$ .

## 2022-01-12 NOTE — Telephone Encounter (Signed)
He should take '10mg'$  daily for 1 month. Then next step will be '9mg'$  daily for 1 month, and slow taper by 1 mg per month until fully off of this.  Let us know when he needs '1mg'$  dose refilled. I didn't send in yet because the first month he can take 2 '5mg'$  tablets to make '10mg'$ /day.

## 2022-01-12 NOTE — Telephone Encounter (Signed)
Patient called in stating that both of his prednisone prescription bottles, both bottles are are indicating at the end of 30 days you should take one '1mg'$  and one 5 mg tablet totaling '6mg'$ . Patient needs clarification. Thank you!

## 2022-01-12 NOTE — Addendum Note (Signed)
Addended by: Ria Bush on: 01/12/2022 05:25 PM   Modules accepted: Orders

## 2022-01-13 NOTE — Telephone Encounter (Signed)
Spoke with pt relaying Dr. G's message. Pt verbalizes understanding.  

## 2022-02-03 ENCOUNTER — Other Ambulatory Visit: Payer: Self-pay | Admitting: Family Medicine

## 2022-02-10 ENCOUNTER — Encounter: Payer: Self-pay | Admitting: Family Medicine

## 2022-02-16 MED ORDER — PREDNISONE 1 MG PO TABS: ORAL_TABLET | ORAL | 0 refills | Status: AC

## 2022-02-16 MED ORDER — PREDNISONE 5 MG PO TABS: 5.0000 mg | ORAL_TABLET | Freq: Every day | ORAL | 0 refills | Status: DC

## 2022-02-25 ENCOUNTER — Telehealth: Payer: Medicare (Managed Care) | Admitting: Family Medicine

## 2022-02-25 NOTE — Telephone Encounter (Signed)
Rtn pt's call about PT.  Pt states PT done in town was covered, however, PT done at [facility] needed authorization and wants to know what can be done since his insurance denied it.

## 2022-02-25 NOTE — Telephone Encounter (Signed)
Patient called in stating that his insurance company sent a letter letting him know that coverage for the PT that Dr G suggest he have,has been denied. They are needing some type of authorization. He would like a phone call to discuss this matter.

## 2022-03-20 NOTE — Telephone Encounter (Signed)
Called and spoke with [facility] and they have not received anything from the insurance regarding a denial of PT services. The only thing they have on file is the order that I sent to the them back in April 2023.   Called and LM for patient to return my call about this. We have never received any type of denial for PT Services at [facility] nor have we had to do a referral for PT for [facility].

## 2022-03-20 NOTE — Telephone Encounter (Signed)
Pt spoke with patient. He states that he spoke with [place] and they found that they made a mistake when processing his claim for PT services. They are fixing this and he does not owe anything for services provided and everything is covered. I have made [facility] aware also as they requested a call back with updates.   Nothing further needed.

## 2022-03-20 NOTE — Telephone Encounter (Signed)
Spoke with patient - he is going to follow up with his insurance to see if anything is needed at this time. He will be calling back next week to discuss further steps needed.

## 2022-03-22 ENCOUNTER — Other Ambulatory Visit: Payer: Self-pay | Admitting: Family Medicine

## 2022-03-22 DIAGNOSIS — E785 Hyperlipidemia, unspecified: Secondary | ICD-10-CM

## 2022-03-25 ENCOUNTER — Other Ambulatory Visit (INDEPENDENT_AMBULATORY_CARE_PROVIDER_SITE_OTHER): Payer: Medicare (Managed Care)

## 2022-03-25 DIAGNOSIS — E785 Hyperlipidemia, unspecified: Secondary | ICD-10-CM

## 2022-03-25 LAB — COMPREHENSIVE METABOLIC PANEL
ALT: 15 U/L (ref 0–53)
AST: 12 U/L (ref 0–37)
Albumin: 3.9 g/dL (ref 3.5–5.2)
Alkaline Phosphatase: 41 U/L (ref 39–117)
BUN: 23 mg/dL (ref 6–23)
CO2: 30 mEq/L (ref 19–32)
Calcium: 9.2 mg/dL (ref 8.4–10.5)
Chloride: 102 mEq/L (ref 96–112)
Creatinine, Ser: 1.02 mg/dL (ref 0.40–1.50)
GFR: 65.59 mL/min (ref >60.00)
Glucose, Bld: 163 mg/dL — ABNORMAL HIGH (ref 70–99)
Potassium: 3.7 mEq/L (ref 3.5–5.1)
Sodium: 139 mEq/L (ref 135–145)
Total Bilirubin: 0.7 mg/dL (ref 0.2–1.2)
Total Protein: 6.3 g/dL (ref 6.0–8.3)

## 2022-03-25 LAB — LIPID PANEL
Cholesterol: 175 mg/dL (ref 0–200)
HDL: 62.7 mg/dL (ref >39.00)
LDL Cholesterol: 90 mg/dL (ref 0–99)
NonHDL: 112.24
Total CHOL/HDL Ratio: 3
Triglycerides: 113 mg/dL (ref 0.0–149.0)
VLDL: 22.6 mg/dL (ref 0.0–40.0)

## 2022-03-29 ENCOUNTER — Encounter: Payer: Self-pay | Admitting: Family Medicine

## 2022-04-01 ENCOUNTER — Encounter: Payer: Self-pay | Admitting: Family Medicine

## 2022-04-01 ENCOUNTER — Other Ambulatory Visit: Payer: Self-pay | Admitting: Family Medicine

## 2022-04-01 ENCOUNTER — Ambulatory Visit (INDEPENDENT_AMBULATORY_CARE_PROVIDER_SITE_OTHER): Payer: Medicare (Managed Care) | Admitting: Family Medicine

## 2022-04-01 VITALS — BP 134/62 | HR 91 | Temp 97.3°F | Ht 68.5 in | Wt 173.0 lb

## 2022-04-01 DIAGNOSIS — R739 Hyperglycemia, unspecified: Secondary | ICD-10-CM | POA: Insufficient documentation

## 2022-04-01 DIAGNOSIS — Z0000 Encounter for general adult medical examination without abnormal findings: Secondary | ICD-10-CM

## 2022-04-01 DIAGNOSIS — E785 Hyperlipidemia, unspecified: Secondary | ICD-10-CM

## 2022-04-01 DIAGNOSIS — M85851 Other specified disorders of bone density and structure, right thigh: Secondary | ICD-10-CM

## 2022-04-01 DIAGNOSIS — R3914 Feeling of incomplete bladder emptying: Secondary | ICD-10-CM

## 2022-04-01 DIAGNOSIS — Z23 Encounter for immunization: Secondary | ICD-10-CM | POA: Diagnosis not present

## 2022-04-01 DIAGNOSIS — N401 Enlarged prostate with lower urinary tract symptoms: Secondary | ICD-10-CM

## 2022-04-01 DIAGNOSIS — Z8673 Personal history of transient ischemic attack (TIA), and cerebral infarction without residual deficits: Secondary | ICD-10-CM | POA: Insufficient documentation

## 2022-04-01 DIAGNOSIS — M353 Polymyalgia rheumatica: Secondary | ICD-10-CM

## 2022-04-01 DIAGNOSIS — R011 Cardiac murmur, unspecified: Secondary | ICD-10-CM

## 2022-04-01 LAB — POCT GLYCOSYLATED HEMOGLOBIN (HGB A1C): Hemoglobin A1C: 5.7 % — AB (ref 4.0–5.6)

## 2022-04-01 NOTE — Assessment & Plan Note (Signed)
Incidentally noted on brain MRI 01/2022 - R thalamus ischemic infarct, R cerebellar infarct. Discussed with patient.  Discussed possible aspirin, statin. As pt asxs, will hold off on new medication, await neurology recommendations.

## 2022-04-01 NOTE — Progress Notes (Signed)
Patient ID: Harry Ray, male    DOB: 09-25-1931, 88 y.o.   MRN: [id number]  This visit was conducted in person.  BP 134/62   Pulse 91   Temp (!) 97.3 F (36.3 C) (Temporal)   Ht 5' 8.5" (1.74 m)   Wt 173 lb (78.5 kg)   SpO2 100%   BMI 25.92 kg/m    CC: CPE Subjective:   HPI: Harry Ray is a 88 y.o. male presenting on 04/01/2022 for Annual Exam (MCR prt 2. )   Saw health advisor 09/2021 for medicare wellness visit. Note reviewed.   No results found.  Flowsheet Row Clinical Support from 09/26/2021 in [facility] [place]  PHQ-2 Total Score 0          09/26/2021    9:12 AM 09/25/2020    8:52 AM 09/25/2019    9:33 AM 09/23/2018    8:27 AM 09/09/2017    8:15 AM  Fall Risk   Falls in the past year? 0 0 0 0 No  Number falls in past yr: 0 0 0    Injury with Fall? 0 0 0    Risk for fall due to : No Fall Risks Impaired balance/gait;Medication side effect No Fall Risks    Follow up  Falls evaluation completed;Falls prevention discussed Falls evaluation completed;Falls prevention discussed     Bilateral shoulder pain started ~04/2021. No hip pain. ESR was not elevated 05/2021. Significant improvement on prednisone 75m started 11/2021 suggesting PMR. Currently on 818mprednisone dose.   Notes more difficulty handling stressors. This has improved since he's been able to be relieved of some responsibilities he had.   Preventative: COLONOSCOPY 08/07/2005 Diverticulosis (Colon) - age out.  Prostate cancer screening - see above Lung cancer screening - not eligible Flu shot - yearly COVID vaccine - Moderna 06/2019, 07/2019, Moderna booster x2 04/2020, 10/24/20, bivalent 02/2021, monovalent 5/20223  Tdap 2016 Pneumovax 2011, prevnar 2016 zostavax - 2011 Shingrix - discussed.  HCPOA/Advanced directive scanned into chart 08/2016 - son HuCamila Lir daughter KrCarmell Austriare HCFoxGrants discretion to HCUniversal Healthor end of life decisions. [place] lives in [place] Seat belt use  discussed Sunscreen use discussed. 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 Sees derm yearly Dr DaEvorn Gong Non smoker Alcohol - none Eye exam yearly Dentist yearly Bowel - no constipation Bladder - see above  Widower[facility] Was married for 6259rs. Wife suddenly passed away on operating table 02/2019.  [place] Occ: retired, was [employer]: [place] exercise program (less this year), walking 1-2 mi/day Diet: good water, fruits/vegetables daily     Relevant past medical, surgical, family and social history reviewed and updated as indicated. Interim medical history since our last visit reviewed. Allergies and medications reviewed and updated. Outpatient Medications Prior to Visit  Medication Sig Dispense Refill   cetirizine (ZYRTEC) 10 MG tablet Take 10 mg by mouth daily as needed for allergies. For seasonal allergies     Cholecalciferol (VITAMIN D3) 25 MCG (1000 UT) CAPS Take 1 capsule (1,000 Units total) by mouth daily. 30 capsule    finasteride (PROSCAR) 5 MG tablet TAKE 1 TABLET BY MOUTH EVERY DAY 90 tablet 0   fluticasone (FLONASE) 50 MCG/ACT nasal spray Place 2 sprays into both nostrils daily as needed for allergies or rhinitis. (Patient taking differently: Place 2 sprays into both nostrils daily as  needed for allergies or rhinitis. 1 spray in each nostril daily) 16 g 6   Lifitegrast (XIIDRA) 5 % SOLN      Multiple Vitamins-Minerals (ICAPS AREDS 2 PO) Take 1 tablet by mouth 2 (two) times daily.     Polyethyl Glycol-Propyl Glycol (SYSTANE OP) Apply to eye.     predniSONE (DELTASONE) 1 MG tablet Take 3 tablets (3 mg total) by mouth daily with breakfast for 30 days, THEN 2 tablets (2 mg total) daily with breakfast for 30 days, THEN 1 tablet (1 mg total) daily with breakfast. With 33m dose for total dose, monthly taper. 180 tablet 0   predniSONE (DELTASONE) 5 MG tablet Take 1  tablet (5 mg total) by mouth daily with breakfast. With 1110mdose for 48m37mirst month then 7mg62mcond month then 6mg 54mrd month 90 tablet 0   tamsulosin (FLOMAX) 0.4 MG CAPS capsule TAKE 1 CAPSULE(0.4 MG) BY MOUTH DAILY 90 capsule 0   No facility-administered medications prior to visit.     Per HPI unless specifically indicated in ROS section below Review of Systems  Constitutional:  Negative for activity change, appetite change, chills, fatigue, fever and unexpected weight change.  HENT:  Negative for hearing loss.   Eyes:  Negative for visual disturbance.  Respiratory:  Negative for cough, chest tightness, shortness of breath and wheezing.   Cardiovascular:  Negative for chest pain, palpitations and leg swelling.  Gastrointestinal:  Negative for abdominal distention, abdominal pain, blood in stool, constipation, diarrhea, nausea and vomiting.  Genitourinary:  Negative for difficulty urinating and hematuria.  Musculoskeletal:  Negative for arthralgias, myalgias and neck pain.  Skin:  Negative for rash.  Neurological:  Negative for dizziness, seizures, syncope and headaches.  Hematological:  Negative for adenopathy. Does not bruise/bleed easily.  Psychiatric/Behavioral:  Negative for dysphoric mood. The patient is not nervous/anxious.     Objective:  BP 134/62   Pulse 91   Temp (!) 97.3 F (36.3 C) (Temporal)   Ht 5' 8.5" (1.74 m)   Wt 173 lb (78.5 kg)   SpO2 100%   BMI 25.92 kg/m   Wt Readings from Last 3 Encounters:  04/01/22 173 lb (78.5 kg)  12/22/21 179 lb (81.2 kg)  09/26/21 179 lb (81.2 kg)      Physical Exam Vitals and nursing note reviewed.  Constitutional:      General: He is not in acute distress.    Appearance: Normal appearance. He is well-developed. He is not ill-appearing.  HENT:     Head: Normocephalic and atraumatic.     Right Ear: Hearing, tympanic membrane, ear canal and external ear normal.     Left Ear: Hearing, tympanic membrane, ear canal and  external ear normal.  Eyes:     General: No scleral icterus.    Extraocular Movements: Extraocular movements intact.     Conjunctiva/sclera: Conjunctivae normal.     Pupils: Pupils are equal, round, and reactive to light.  Neck:     Thyroid: No thyroid mass or thyromegaly.     Vascular: No carotid bruit.  Cardiovascular:     Rate and Rhythm: Normal rate and regular rhythm.     Pulses: Normal pulses.          Radial pulses are 2+ on the right side and 2+ on the left side.     Heart sounds: Murmur (3/6 systolic along sternal border) heard.  Pulmonary:     Effort: Pulmonary effort is normal. No respiratory distress.  Breath sounds: Normal breath sounds. No wheezing, rhonchi or rales.  Abdominal:     General: Bowel sounds are normal. There is no distension.     Palpations: Abdomen is soft. There is no mass.     Tenderness: There is no abdominal tenderness. There is no guarding or rebound.     Hernia: No hernia is present.  Musculoskeletal:        General: Normal range of motion.     Cervical back: Normal range of motion and neck supple.     Right lower leg: No edema.     Left lower leg: No edema.  Lymphadenopathy:     Cervical: No cervical adenopathy.  Skin:    General: Skin is warm and dry.     Findings: No rash.  Neurological:     General: No focal deficit present.     Mental Status: He is alert and oriented to person, place, and time.     Comments: Postural tremor present  Psychiatric:        Mood and Affect: Mood normal.        Behavior: Behavior normal.        Thought Content: Thought content normal.        Judgment: Judgment normal.       Results for orders placed or performed in visit on 03/25/22  Lipid panel  Result Value Ref Range   Cholesterol 175 0 - 200 mg/dL   Triglycerides 113.0 0.0 - 149.0 mg/dL   HDL 62.70 >39.00 mg/dL   VLDL 22.6 0.0 - 40.0 mg/dL   LDL Cholesterol 90 0 - 99 mg/dL   Total CHOL/HDL Ratio 3    NonHDL 112.24   Comprehensive metabolic  panel  Result Value Ref Range   Sodium 139 135 - 145 mEq/L   Potassium 3.7 3.5 - 5.1 mEq/L   Chloride 102 96 - 112 mEq/L   CO2 30 19 - 32 mEq/L   Glucose, Bld 163 (H) 70 - 99 mg/dL   BUN 23 6 - 23 mg/dL   Creatinine, Ser 1.02 0.40 - 1.50 mg/dL   Total Bilirubin 0.7 0.2 - 1.2 mg/dL   Alkaline Phosphatase 41 39 - 117 U/L   AST 12 0 - 37 U/L   ALT 15 0 - 53 U/L   Total Protein 6.3 6.0 - 8.3 g/dL   Albumin 3.9 3.5 - 5.2 g/dL   GFR 65.59 >60.00 mL/min   Calcium 9.2 8.4 - 10.5 mg/dL    Assessment & Plan:   Problem List Items Addressed This Visit     Health maintenance examination - Primary (Chronic)    Preventative protocols reviewed and updated unless pt declined. Discussed healthy diet and lifestyle.       BPH (benign prostatic hyperplasia)    Chronic, stable on flomax and finasteride.       Systolic murmur    Mild, continue to monitor.       Dyslipidemia    Chronic, stable off medication. The ASCVD Risk score (Arnett DK, et al., 2019) failed to calculate for the following reasons:   The 2019 ASCVD risk score is only valid for ages 72 to 75       Osteopenia    He is undergoing prolonged prednisone taper.  Consider updated DEXA next year.       PMR (polymyalgia rheumatica) (HCC)    Asymptomatic on prednisone taper, currently on 77m prednisone dose with planned monthly drop until full taper completed  Hyperglycemia    Prednisone-related. A1c today 5.7%. pt not interested in medication. Recommend low sugar low carb diet while on prednisone. Reassess at f/u visit.       Relevant Orders   POCT glycosylated hemoglobin (Hb A1C)   History of ischemic stroke    Incidentally noted on brain MRI 01/2022 - R thalamus ischemic infarct, R cerebellar infarct. Discussed with patient.  Discussed possible aspirin, statin. As pt asxs, will hold off on new medication, await neurology recommendations.       Other Visit Diagnoses     Need for influenza vaccination        Relevant Orders   Flu Vaccine QUAD High Dose(Fluad) (Completed)        No orders of the defined types were placed in this encounter.  Orders Placed This Encounter  Procedures   Flu Vaccine QUAD High Dose(Fluad)   POCT glycosylated hemoglobin (Hb A1C)    Patient instructions: Flu shot today  A1c today  Good to see you today.  Return in 3 months for prednisone recheck.   Follow up plan: Return in about 3 months (around 07/02/2022) for follow up visit.  Ria Bush, MD

## 2022-04-01 NOTE — Assessment & Plan Note (Signed)
Prednisone-related. A1c today 5.7%. pt not interested in medication. Recommend low sugar low carb diet while on prednisone. Reassess at f/u visit.

## 2022-04-01 NOTE — Assessment & Plan Note (Signed)
Chronic, stable on flomax and finasteride.

## 2022-04-01 NOTE — Assessment & Plan Note (Signed)
Chronic, stable off medication. The ASCVD Risk score (Arnett DK, et al., 2019) failed to calculate for the following reasons:   The 2019 ASCVD risk score is only valid for ages 65 to 49

## 2022-04-01 NOTE — Assessment & Plan Note (Signed)
Preventative protocols reviewed and updated unless pt declined. Discussed healthy diet and lifestyle.  

## 2022-04-01 NOTE — Patient Instructions (Addendum)
Flu shot today  A1c today  Good to see you today.  Return in 3 months for prednisone recheck.   Health Maintenance After Age 86 After age 98, you are at a higher risk for certain long-term diseases and infections as well as injuries from falls. Falls are a major cause of broken bones and head injuries in people who are older than age 48. Getting regular preventive care can help to keep you healthy and well. Preventive care includes getting regular testing and making lifestyle changes as recommended by your health care provider. Talk with your health care provider about: Which screenings and tests you should have. A screening is a test that checks for a disease when you have no symptoms. A diet and exercise plan that is right for you. What should I know about screenings and tests to prevent falls? Screening and testing are the best ways to find a health problem early. Early diagnosis and treatment give you the best chance of managing medical conditions that are common after age 87. Certain conditions and lifestyle choices may make you more likely to have a fall. Your health care provider may recommend: Regular vision checks. Poor vision and conditions such as cataracts can make you more likely to have a fall. If you wear glasses, make sure to get your prescription updated if your vision changes. Medicine review. Work with your health care provider to regularly review all of the medicines you are taking, including over-the-counter medicines. Ask your health care provider about any side effects that may make you more likely to have a fall. Tell your health care provider if any medicines that you take make you feel dizzy or sleepy. Strength and balance checks. Your health care provider may recommend certain tests to check your strength and balance while standing, walking, or changing positions. Foot health exam. Foot pain and numbness, as well as not wearing proper footwear, can make you more likely to  have a fall. Screenings, including: Osteoporosis screening. Osteoporosis is a condition that causes the bones to get weaker and break more easily. Blood pressure screening. Blood pressure changes and medicines to control blood pressure can make you feel dizzy. Depression screening. You may be more likely to have a fall if you have a fear of falling, feel depressed, or feel unable to do activities that you used to do. Alcohol use screening. Using too much alcohol can affect your balance and may make you more likely to have a fall. Follow these instructions at home: Lifestyle Do not drink alcohol if: Your health care provider tells you not to drink. If you drink alcohol: Limit how much you have to: 0-1 drink a day for women. 0-2 drinks a day for men. Know how much alcohol is in your drink. In the [place] one drink equals one 12 oz bottle of beer (355 mL), one 5 oz glass of wine (148 mL), or one 1 oz glass of hard liquor (44 mL). Do not use any products that contain nicotine or tobacco. These products include cigarettes, chewing tobacco, and vaping devices, such as e-cigarettes. If you need help quitting, ask your health care provider. Activity  Follow a regular exercise program to stay fit. This will help you maintain your balance. Ask your health care provider what types of exercise are appropriate for you. If you need a cane or walker, use it as recommended by your health care provider. Wear supportive shoes that have nonskid soles. Safety  Remove any tripping hazards, such as  rugs, cords, and clutter. Install safety equipment such as grab bars in bathrooms and safety rails on stairs. Keep rooms and walkways well-lit. General instructions Talk with your health care provider about your risks for falling. Tell your health care provider if: You fall. Be sure to tell your health care provider about all falls, even ones that seem minor. You feel dizzy, tiredness (fatigue), or  off-balance. Take over-the-counter and prescription medicines only as told by your health care provider. These include supplements. Eat a healthy diet and maintain a healthy weight. A healthy diet includes low-fat dairy products, low-fat (lean) meats, and fiber from whole grains, beans, and lots of fruits and vegetables. Stay current with your vaccines. Schedule regular health, dental, and eye exams. Summary Having a healthy lifestyle and getting preventive care can help to protect your health and wellness after age 2. Screening and testing are the best way to find a health problem early and help you avoid having a fall. Early diagnosis and treatment give you the best chance for managing medical conditions that are more common for people who are older than age 47. Falls are a major cause of broken bones and head injuries in people who are older than age 13. Take precautions to prevent a fall at home. Work with your health care provider to learn what changes you can make to improve your health and wellness and to prevent falls. This information is not intended to replace advice given to you by your health care provider. Make sure you discuss any questions you have with your health care provider. Document Revised: 10/14/2020 Document Reviewed: 10/14/2020 Elsevier Patient Education  [employer].

## 2022-04-01 NOTE — Assessment & Plan Note (Signed)
He is undergoing prolonged prednisone taper.  Consider updated DEXA next year.

## 2022-04-01 NOTE — Assessment & Plan Note (Signed)
Asymptomatic on prednisone taper, currently on '8mg'$  prednisone dose with planned monthly drop until full taper completed

## 2022-04-01 NOTE — Assessment & Plan Note (Signed)
Mild, continue to monitor.

## 2022-05-04 ENCOUNTER — Other Ambulatory Visit: Payer: Self-pay | Admitting: Family Medicine

## 2022-05-10 ENCOUNTER — Other Ambulatory Visit: Payer: Self-pay | Admitting: Family Medicine

## 2022-05-11 ENCOUNTER — Other Ambulatory Visit: Payer: Self-pay | Admitting: Family Medicine

## 2022-05-11 NOTE — Telephone Encounter (Signed)
Prednisone 5 mg Last rx:  02/16/22, #90 Last OV:  04/01/22, CPE Next OV:  07/03/22, 3 mo prednisone rechk

## 2022-05-12 MED ORDER — PREDNISONE 5 MG PO TABS
5.0000 mg | ORAL_TABLET | Freq: Every day | ORAL | 0 refills | Status: DC
Start: 2022-05-12 — End: 2022-07-03

## 2022-05-12 NOTE — Telephone Encounter (Signed)
ERx 

## 2022-06-15 ENCOUNTER — Telehealth: Payer: Medicare (Managed Care) | Admitting: Emergency Medicine

## 2022-06-15 DIAGNOSIS — Z20822 Contact with and (suspected) exposure to covid-19: Secondary | ICD-10-CM | POA: Diagnosis not present

## 2022-06-15 NOTE — Patient Instructions (Signed)
[name], thank you for joining Carvel Getting, NP for today's virtual visit.  While this provider is not your primary care provider (PCP), if your PCP is located in our provider database this encounter information will be shared with them immediately following your visit.   [facility] account gives you access to today's visit and all your visits, tests, and labs performed at [facility] " click here if you don't have a [facility] account or go to [email or web address]  Consent: (Patient) Harry Ray provided verbal consent for this virtual visit at the beginning of the encounter.  Current Medications:  Current Outpatient Medications:    cetirizine (ZYRTEC) 10 MG tablet, Take 10 mg by mouth daily as needed for allergies. For seasonal allergies, Disp: , Rfl:    Cholecalciferol (VITAMIN D3) 25 MCG (1000 UT) CAPS, Take 1 capsule (1,000 Units total) by mouth daily., Disp: 30 capsule, Rfl:    finasteride (PROSCAR) 5 MG tablet, TAKE 1 TABLET BY MOUTH EVERY DAY, Disp: 90 tablet, Rfl: 0   fluticasone (FLONASE) 50 MCG/ACT nasal spray, Place 2 sprays into both nostrils daily as needed for allergies or rhinitis. (Patient taking differently: Place 2 sprays into both nostrils daily as needed for allergies or rhinitis. 1 spray in each nostril daily), Disp: 16 g, Rfl: 6   Lifitegrast (XIIDRA) 5 % SOLN, , Disp: , Rfl:    Multiple Vitamins-Minerals (ICAPS AREDS 2 PO), Take 1 tablet by mouth 2 (two) times daily., Disp: , Rfl:    Polyethyl Glycol-Propyl Glycol (SYSTANE OP), Apply to eye., Disp: , Rfl:    predniSONE (DELTASONE) 5 MG tablet, Take 1 tablet (5 mg total) by mouth daily with breakfast. With '1mg'$  dose as per tapering instructions, currently on '6mg'$  daily, Disp: 60 tablet, Rfl: 0   tamsulosin (FLOMAX) 0.4 MG CAPS capsule, TAKE 1 CAPSULE(0.4 MG) BY MOUTH DAILY, Disp: 90 capsule, Rfl: 3   Medications ordered in this encounter:  No orders of the defined types  were placed in this encounter.    *If you need refills on other medications prior to your next appointment, please contact your pharmacy*  Follow-Up: Call back or seek an in-person evaluation if the symptoms worsen or if the condition fails to improve as anticipated.  [facility] [phone]  What to Do If You Were Around Someone Who Has COVID-19 Get tested if you were around someone with COVID-19.?Take a COVID-19 test at least 5 days after you were around that person.?Take a COVID-19 test even if you feel fine. Watch for symptoms if you were around someone with COVID-19.?Watch for COVID-19 symptoms for 10 days. Be cautious if you were around someone with COVID-19.?Wear a mask for 10 days around others in your home.?Wear a mask for 10 days around others in public.?Be extra careful around people who are likely to get very sick from COVID-19.  What to Do If Someone With [place] Recently Let in air from outside to clear the air in your home.?Open doors to let in air from outside.?Open windows to let in air from outside.?Use fans to clear the air in your home.?Use a portable air cleaner to help filter the air in your home.?The best portable air cleaners are called "HEPA" cleaners.          If you have been instructed to have an in-person evaluation today at a local Urgent Care facility, please use the link below. It will take you to a  list of all of our available [facility], including address, phone number and hours of operation. Please do not delay care.  [facility]  If you or a family member do not have a primary care provider, use the link below to schedule a visit and establish care. When you choose a [facility] primary care physician or advanced practice provider, you gain a long-term partner in health. Find a Primary Care Provider  Learn more about [facility]'s in-office and virtual care options: [facility]

## 2022-06-15 NOTE — Progress Notes (Signed)
Virtual Visit Consent   Harry Ray, you are scheduled for a virtual visit with a [facility] provider today. Just as with appointments in the office, your consent must be obtained to participate. Your consent will be active for this visit and any virtual visit you may have with one of our providers in the next 365 days. If you have a MyChart account, a copy of this consent can be sent to you electronically.  As this is a virtual visit, video technology does not allow for your provider to perform a traditional examination. This may limit your provider's ability to fully assess your condition. If your provider identifies any concerns that need to be evaluated in person or the need to arrange testing (such as labs, EKG, etc.), we will make arrangements to do so. Although advances in technology are sophisticated, we cannot ensure that it will always work on either your end or our end. If the connection with a video visit is poor, the visit may have to be switched to a telephone visit. With either a video or telephone visit, we are not always able to ensure that we have a secure connection.  By engaging in this virtual visit, you consent to the provision of healthcare and authorize for your insurance to be billed (if applicable) for the services provided during this visit. Depending on your insurance coverage, you may receive a charge related to this service.  I need to obtain your verbal consent now. Are you willing to proceed with your visit today? HARVEER SADLER has provided verbal consent on 06/15/2022 for a virtual visit (video or telephone). Carvel Getting, NP  Date: 06/15/2022 10:27 AM  Virtual Visit via Video Note   I, Carvel Getting, connected with  Harry Ray  ([id number], 03/03/1932) on 06/15/22 at 10:15 AM EST by a video-enabled telemedicine application and verified that I am speaking with the correct person using two identifiers.  Location: Patient: Virtual Visit Location Patient:  Home Provider: Virtual Visit Location Provider: Home Office   I discussed the limitations of evaluation and management by telemedicine and the availability of in person appointments. The patient expressed understanding and agreed to proceed.    History of Present Illness: Harry Ray is a 89 y.o. who identifies as a male who was assigned male at birth, and is being seen today for covid exposure.  He had lunch with his son and his son's family yesterday.  Last night his son began feeling ill and he tested positive for COVID this morning.  [name] does not have any symptoms and does not feel poorly at this time at all but wants to know what he needs to do based on this exposure.  HPI: HPI  Problems:  Patient Active Problem List   Diagnosis Date Noted   Hyperglycemia 04/01/2022   History of ischemic stroke 04/01/2022   Stammering/stuttering 09/04/2021   PMR (polymyalgia rheumatica) ([place]) 05/12/2021   Localized swelling on right hand 05/12/2021   Osteopenia 12/03/2020   Dyspnea on exertion 11/22/2020   Thoracic compression fracture ([place]) 09/23/2020   Wheezing 09/18/2020   Scrotal cyst 09/18/2020   Health maintenance examination 10/02/2019   Bowel incontinence 10/02/2019   Dyslipidemia 09/27/2018   Dizziness 06/24/2018   Urge incontinence 09/09/2017   Right thyroid nodule 07/17/2017   Carotid stenosis 10/07/2016   Skin lesion of back 09/11/2016   Medicare annual wellness visit, subsequent 08/25/2016   Advanced care planning/counseling discussion 08/25/2016   BPH (benign  prostatic hyperplasia) 16/03/9603   Systolic murmur     Allergies: No Known Allergies Medications:  Current Outpatient Medications:    cetirizine (ZYRTEC) 10 MG tablet, Take 10 mg by mouth daily as needed for allergies. For seasonal allergies, Disp: , Rfl:    Cholecalciferol (VITAMIN D3) 25 MCG (1000 UT) CAPS, Take 1 capsule (1,000 Units total) by mouth daily., Disp: 30 capsule, Rfl:    finasteride (PROSCAR) 5  MG tablet, TAKE 1 TABLET BY MOUTH EVERY DAY, Disp: 90 tablet, Rfl: 0   fluticasone (FLONASE) 50 MCG/ACT nasal spray, Place 2 sprays into both nostrils daily as needed for allergies or rhinitis. (Patient taking differently: Place 2 sprays into both nostrils daily as needed for allergies or rhinitis. 1 spray in each nostril daily), Disp: 16 g, Rfl: 6   Lifitegrast (XIIDRA) 5 % SOLN, , Disp: , Rfl:    Multiple Vitamins-Minerals (ICAPS AREDS 2 PO), Take 1 tablet by mouth 2 (two) times daily., Disp: , Rfl:    Polyethyl Glycol-Propyl Glycol (SYSTANE OP), Apply to eye., Disp: , Rfl:    predniSONE (DELTASONE) 5 MG tablet, Take 1 tablet (5 mg total) by mouth daily with breakfast. With '1mg'$  dose as per tapering instructions, currently on '6mg'$  daily, Disp: 60 tablet, Rfl: 0   tamsulosin (FLOMAX) 0.4 MG CAPS capsule, TAKE 1 CAPSULE(0.4 MG) BY MOUTH DAILY, Disp: 90 capsule, Rfl: 3  Observations/Objective: Patient is well-developed, well-nourished in no acute distress.  Resting comfortably  at home.  Head is normocephalic, atraumatic.  No labored breathing.  Speech is clear and coherent with logical content.  Patient is alert and oriented at baseline.    Assessment and Plan: 1. Close exposure to COVID-19 virus  We discussed quarantining for at least the next 5 days.  He will self monitor for symptoms of COVID and if he test positive on a home test, we will contact his PCP or [place] right away to get Paxlovid prescribed.  If he is asymptomatic and COVID-negative after 5 days, he can resume normal life activities as long as he is wearing a good-quality mask for an additional 5 days.  Follow Up Instructions: I discussed the assessment and treatment plan with the patient. The patient was provided an opportunity to ask questions and all were answered. The patient agreed with the plan and demonstrated an understanding of the instructions.  A copy of instructions were sent to the patient via MyChart unless otherwise  noted below.   The patient was advised to call back or seek an in-person evaluation if the symptoms worsen or if the condition fails to improve as anticipated.  Time:  I spent 11 minutes with the patient via telehealth technology discussing the above problems/concerns.    Carvel Getting, NP

## 2022-06-24 ENCOUNTER — Encounter: Payer: Self-pay | Admitting: Family Medicine

## 2022-07-03 ENCOUNTER — Ambulatory Visit (INDEPENDENT_AMBULATORY_CARE_PROVIDER_SITE_OTHER): Payer: Medicare (Managed Care) | Admitting: Family Medicine

## 2022-07-03 ENCOUNTER — Encounter: Payer: Self-pay | Admitting: Family Medicine

## 2022-07-03 VITALS — BP 124/70 | HR 88 | Temp 97.3°F | Ht 68.5 in | Wt 176.4 lb

## 2022-07-03 DIAGNOSIS — Z8673 Personal history of transient ischemic attack (TIA), and cerebral infarction without residual deficits: Secondary | ICD-10-CM

## 2022-07-03 DIAGNOSIS — M85851 Other specified disorders of bone density and structure, right thigh: Secondary | ICD-10-CM

## 2022-07-03 DIAGNOSIS — F8081 Childhood onset fluency disorder: Secondary | ICD-10-CM

## 2022-07-03 DIAGNOSIS — R609 Edema, unspecified: Secondary | ICD-10-CM

## 2022-07-03 DIAGNOSIS — M353 Polymyalgia rheumatica: Secondary | ICD-10-CM

## 2022-07-03 DIAGNOSIS — R600 Localized edema: Secondary | ICD-10-CM | POA: Insufficient documentation

## 2022-07-03 NOTE — Assessment & Plan Note (Addendum)
Saw neurology, no recommendations re aspirin.  Will consider starting once prednisone  taper complete.

## 2022-07-03 NOTE — Patient Instructions (Addendum)
You are doing well today Continue prednisone taper by 1 mg/month, starting '4mg'$  in February Let us know if any headache, vision changes, jaw pain persists.  Return in 9 months for physical.

## 2022-07-03 NOTE — Progress Notes (Signed)
Patient ID: Harry Ray, male    DOB: [birth year], 89 y.o.   MRN: [id number]  This visit was conducted in person.  BP 124/70   Pulse 88   Temp (!) 97.3 F (36.3 C) (Temporal)   Ht 5' 8.5" (1.74 m)   Wt 176 lb 6 oz (80 kg)   SpO2 99%   BMI 26.43 kg/m    CC: PMR f/u visit  Subjective:   HPI: Harry Ray is a 89 y.o. male presenting on 07/03/2022 for Medical Management of Chronic Issues (Here for 3 mo prednisone rechk. )   See prior notes for details.  Bilateral shoulder pain started 04/2021. ESR was normal. Significant improvement on prednisone started 11/2021 suggesting PMR. Has had prolonged prednisone taper since then, currently on '4mg'$  starting next week. No shoulder pain, hip pain, fatigue, malaise.   Denies headache, vision changes. Did have episode of L jaw tenderness with chewing for a week, now resolved. To let me know if this recurs.   Found to have osteopenia on DEXA 11/2020, with increased hip fracture risk. He continues vitamin D regularly.   He's [facility] neurology Dr Manuella Ghazi and Eulas Post PA.       Relevant past medical, surgical, family and social history reviewed and updated as indicated. Interim medical history since our last visit reviewed. Allergies and medications reviewed and updated. Outpatient Medications Prior to Visit  Medication Sig Dispense Refill   cetirizine (ZYRTEC) 10 MG tablet Take 10 mg by mouth daily as needed for allergies. For seasonal allergies     Cholecalciferol (VITAMIN D3) 25 MCG (1000 UT) CAPS Take 1 capsule (1,000 Units total) by mouth daily. 30 capsule    finasteride (PROSCAR) 5 MG tablet TAKE 1 TABLET BY MOUTH EVERY DAY 90 tablet 0   fluticasone (FLONASE) 50 MCG/ACT nasal spray Place 2 sprays into both nostrils daily as needed for allergies or rhinitis. (Patient taking differently: Place 2 sprays into both nostrils daily as needed for allergies or rhinitis. 1 spray in each nostril daily) 16 g 6   Lifitegrast (XIIDRA) 5 % SOLN       Multiple Vitamins-Minerals (ICAPS AREDS 2 PO) Take 1 tablet by mouth 2 (two) times daily.     Polyethyl Glycol-Propyl Glycol (SYSTANE OP) Apply to eye.     tamsulosin (FLOMAX) 0.4 MG CAPS capsule TAKE 1 CAPSULE(0.4 MG) BY MOUTH DAILY 90 capsule 3   [START ON 07/07/2022] predniSONE (DELTASONE) 1 MG tablet Take 4 tablets (4 mg total) by mouth daily with breakfast. Continue monthly taper by 1 mg/month     predniSONE (DELTASONE) 5 MG tablet Take 1 tablet (5 mg total) by mouth daily with breakfast. With '1mg'$  dose as per tapering instructions, currently on '6mg'$  daily 60 tablet 0   [START ON 07/09/2022] predniSONE (DELTASONE) 1 MG tablet Take 4 tablets (4 mg total) by mouth daily with breakfast. Continue monthly taper by 1 mg/month     No facility-administered medications prior to visit.     Per HPI unless specifically indicated in ROS section below Review of Systems  Objective:  BP 124/70   Pulse 88   Temp (!) 97.3 F (36.3 C) (Temporal)   Ht 5' 8.5" (1.74 m)   Wt 176 lb 6 oz (80 kg)   SpO2 99%   BMI 26.43 kg/m   Wt Readings from Last 3 Encounters:  07/03/22 176 lb 6 oz (80 kg)  04/01/22 173 lb (78.5 kg)  12/22/21 179 lb (81.2 kg)  Physical Exam Vitals and nursing note reviewed.  Constitutional:      Appearance: Normal appearance. He is not ill-appearing.  HENT:     Head: Normocephalic and atraumatic.     Mouth/Throat:     Comments: Wearing mask Eyes:     Extraocular Movements: Extraocular movements intact.     Conjunctiva/sclera: Conjunctivae normal.     Pupils: Pupils are equal, round, and reactive to light.  Cardiovascular:     Rate and Rhythm: Normal rate and regular rhythm.     Pulses: Normal pulses.     Heart sounds: Murmur (3/6 systolic best at apex) heard.  Pulmonary:     Effort: Pulmonary effort is normal. No respiratory distress.     Breath sounds: Normal breath sounds. No wheezing, rhonchi or rales.  Musculoskeletal:     Right lower leg: Edema (1+) present.      Left lower leg: Edema (1+) present.  Skin:    General: Skin is warm and dry.     Findings: No rash.  Neurological:     Mental Status: He is alert.  Psychiatric:        Mood and Affect: Mood normal.        Behavior: Behavior normal.       Results for orders placed or performed in visit on 04/01/22  POCT glycosylated hemoglobin (Hb A1C)  Result Value Ref Range   Hemoglobin A1C 5.7 (A) 4.0 - 5.6 %   HbA1c POC (<> result, manual entry)     HbA1c, POC (prediabetic range)     HbA1c, POC (controlled diabetic range)      Assessment & Plan:   Problem List Items Addressed This Visit     Osteopenia    Osteopenia with increased fracture risk, on tapering dose of prednisone.  Continue vitamin D replacement.  Rpt DEXA ~11/2021.       PMR (polymyalgia rheumatica) ([facility]) - Primary    Continues improving and tolerating prednisone taper, about to start '4mg'$  dose next week. Reviewed symptoms of temporal arteritis - to let Harry Ray know if any of these develop.       Stammering/stuttering    Saw neurology, reassuring eval.       History of ischemic stroke    Saw neurology, no recommendations re aspirin.  Will consider starting once prednisone  taper complete.       Peripheral edema    Noted today. Reassess once off prednisone.        No orders of the defined types were placed in this encounter.   No orders of the defined types were placed in this encounter.   Patient Instructions  You are doing well today Continue prednisone taper by 1 mg/month, starting '4mg'$  in February Let Harry Ray know if any headache, vision changes, jaw pain persists.  Return in 9 months for physical.   Follow up plan: Return in about 9 months (around 04/03/2023) for annual exam, prior fasting for blood work.  Ria Bush, MD

## 2022-07-03 NOTE — Assessment & Plan Note (Signed)
Saw neurology, reassuring eval.

## 2022-07-03 NOTE — Assessment & Plan Note (Signed)
Continues improving and tolerating prednisone taper, about to start '4mg'$  dose next week. Reviewed symptoms of temporal arteritis - to let us know if any of these develop.

## 2022-07-03 NOTE — Assessment & Plan Note (Signed)
Noted today. Reassess once off prednisone.

## 2022-07-03 NOTE — Assessment & Plan Note (Signed)
Osteopenia with increased fracture risk, on tapering dose of prednisone.  Continue vitamin D replacement.  Rpt DEXA ~11/2021.

## 2022-07-27 ENCOUNTER — Other Ambulatory Visit: Payer: Self-pay | Admitting: Family Medicine

## 2022-07-28 NOTE — Telephone Encounter (Signed)
Prednisone  Last filled:  07/09/22, #4 Last OV:  07/03/22, 3 mo prednisone rechk Next OV:  none

## 2022-08-02 ENCOUNTER — Other Ambulatory Visit: Payer: Self-pay | Admitting: Family Medicine

## 2022-08-02 DIAGNOSIS — N401 Enlarged prostate with lower urinary tract symptoms: Secondary | ICD-10-CM

## 2022-08-17 DIAGNOSIS — B078 Other viral warts: Secondary | ICD-10-CM | POA: Diagnosis not present

## 2022-08-17 DIAGNOSIS — D485 Neoplasm of uncertain behavior of skin: Secondary | ICD-10-CM | POA: Diagnosis not present

## 2022-08-17 DIAGNOSIS — L538 Other specified erythematous conditions: Secondary | ICD-10-CM | POA: Diagnosis not present

## 2022-08-17 DIAGNOSIS — R208 Other disturbances of skin sensation: Secondary | ICD-10-CM | POA: Diagnosis not present

## 2022-09-22 ENCOUNTER — Encounter: Payer: Self-pay | Admitting: Family Medicine

## 2022-09-22 NOTE — Telephone Encounter (Signed)
Updated pt's chart.  

## 2022-09-29 DIAGNOSIS — Z961 Presence of intraocular lens: Secondary | ICD-10-CM | POA: Diagnosis not present

## 2022-09-29 DIAGNOSIS — H35373 Puckering of macula, bilateral: Secondary | ICD-10-CM | POA: Diagnosis not present

## 2022-09-29 DIAGNOSIS — H04123 Dry eye syndrome of bilateral lacrimal glands: Secondary | ICD-10-CM | POA: Diagnosis not present

## 2022-09-29 DIAGNOSIS — H353132 Nonexudative age-related macular degeneration, bilateral, intermediate dry stage: Secondary | ICD-10-CM | POA: Diagnosis not present

## 2022-09-30 ENCOUNTER — Ambulatory Visit (INDEPENDENT_AMBULATORY_CARE_PROVIDER_SITE_OTHER): Payer: Medicare (Managed Care)

## 2022-09-30 VITALS — Ht 68.5 in | Wt 176.0 lb

## 2022-09-30 DIAGNOSIS — Z0000 Encounter for general adult medical examination without abnormal findings: Secondary | ICD-10-CM | POA: Diagnosis not present

## 2022-09-30 NOTE — Progress Notes (Signed)
Subjective:   Harry Ray is a 89 y.o. male who presents for Medicare Annual/Subsequent preventive examination.  Review of Systems    Virtual Visit via Telephone Note  I connected with  Harry Ray on 09/30/22 at 10:00 AM EDT by telephone and verified that I am speaking with the correct person using two identifiers.  Location: Patient: Home Provider: Office Persons participating in the virtual visit: patient/Nurse Health Advisor   I discussed the limitations, risks, security and privacy concerns of performing an evaluation and management service by telephone and the availability of in person appointments. The patient expressed understanding and agreed to proceed.  Interactive audio and video telecommunications were attempted between this nurse and patient, however failed, due to patient having technical difficulties OR patient did not have access to video capability.  We continued and completed visit with audio only.  Some vital signs may be absent or patient reported.   Tillie Rung, LPN  Cardiac Risk Factors include: advanced age (>58men, >59 women);male gender;dyslipidemia     Objective:    Today's Vitals   09/30/22 1006  Weight: 176 lb (79.8 kg)  Height: 5' 8.5" (1.74 m)   Body mass index is 26.37 kg/m.     09/30/2022   10:13 AM 09/26/2021    9:13 AM 09/25/2020    8:51 AM 09/25/2019    9:33 AM 09/23/2018    8:28 AM 09/09/2017    8:16 AM  Advanced Directives  Does Patient Have a Medical Advance Directive? Yes Yes Yes Yes Yes Yes  Type of Estate agent of [place];Living will Healthcare Power of [place];Living will Healthcare Power of [place];Living will Healthcare Power of [place];Living will Healthcare Power of [place];Living will Healthcare Power of [place];Living will  Does patient want to make changes to medical advance directive?  No - Patient declined      Copy of Healthcare Power of Attorney in Chart? No - copy requested No -  copy requested No - copy requested Yes - validated most recent copy scanned in chart (See row information) Yes - validated most recent copy scanned in chart (See row information) Yes  Would patient like information on creating a medical advance directive?     No - Patient declined     Current Medications (verified) Outpatient Encounter Medications as of 09/30/2022  Medication Sig   cetirizine (ZYRTEC) 10 MG tablet Take 10 mg by mouth daily as needed for allergies. For seasonal allergies   Cholecalciferol (VITAMIN D3) 25 MCG (1000 UT) CAPS Take 1 capsule (1,000 Units total) by mouth daily.   finasteride (PROSCAR) 5 MG tablet TAKE 1 TABLET BY MOUTH EVERY DAY   fluticasone (FLONASE) 50 MCG/ACT nasal spray Place 2 sprays into both nostrils daily as needed for allergies or rhinitis. (Patient taking differently: Place 2 sprays into both nostrils daily as needed for allergies or rhinitis. 1 spray in each nostril daily)   Lifitegrast (XIIDRA) 5 % SOLN    Multiple Vitamins-Minerals (ICAPS AREDS 2 PO) Take 1 tablet by mouth 2 (two) times daily.   Polyethyl Glycol-Propyl Glycol (SYSTANE OP) Apply to eye.   predniSONE (DELTASONE) 1 MG tablet TAKE 3 TABLETS BY MOUTH X 30 DAYS, 2 TABLETS X 30 DAYS, 1 TABLET X 30DAYS then stop   tamsulosin (FLOMAX) 0.4 MG CAPS capsule TAKE 1 CAPSULE(0.4 MG) BY MOUTH DAILY   No facility-administered encounter medications on file as of 09/30/2022.    Allergies (verified) Patient has no known allergies.   History: Past  Medical History:  Diagnosis Date   Arthritis    pinkys bilaterally and left thumb   Carotid stenosis 10/07/2016   LICA 40-59%, RICA 1-39%, rpt 1 yr (09/2016)   Heart murmur    minor   History of asthma 1983   asthma attack as reaction to allergy medication (2d ICU stay)   History of chicken pox    Nephritis childhood   Seasonal allergies    spring   Urine incontinence    Past Surgical History:  Procedure Laterality Date   COLONOSCOPY  08/07/2005    Diverticulosis (Colon in [place])   SCROTAL SURGERY     ? benign cyst removal   Family History  Problem Relation Age of Onset   Dementia Mother        Pick's disease   Myasthenia gravis Brother 40   Cancer Maternal Grandfather        stomach   Cancer Paternal Uncle        throat - smoker   Fibromyalgia Daughter    Diabetes Neg Hx    CAD Neg Hx    Social History   Socioeconomic History   Marital status: Widowed    Spouse name: Not on file   Number of children: Not on file   Years of education: Not on file   Highest education level: Not on file  Occupational History   Not on file  Tobacco Use   Smoking status: Never   Smokeless tobacco: Never  Vaping Use   Vaping Use: Never used  Substance and Sexual Activity   Alcohol use: No    Alcohol/week: 0.0 standard drinks of alcohol   Drug use: No   Sexual activity: Not on file  Other Topics Concern   Not on file  Social History Narrative   Widower, lives [facility], no pets    Was married for 62 yrs    Wife suddenly passed away during a surgery March 07, 2019    Son and family live in [place]: retired, was [employer]   Activity: starting [place] exercise program 2x/wk, enjoys walking   Social Determinants of Health   Financial Resource Strain: Low Risk  (09/30/2022)   Overall Financial Resource Strain (CARDIA)    Difficulty of Paying Living Expenses: Not hard at all  Food Insecurity: No Food Insecurity (09/30/2022)   Hunger Vital Sign    Worried About Running Out of Food in the Last Year: Never true    Ran Out of Food in the Last Year: Never true  Transportation Needs: No Transportation Needs (09/30/2022)   PRAPARE - Administrator, Civil Service (Medical): No    Lack of Transportation (Non-Medical): No  Physical Activity: Sufficiently Active (09/30/2022)   Exercise Vital Sign    Days of Exercise per Week: 6 days    Minutes of Exercise per Session: 40 min  Stress: No Stress  Concern Present (09/30/2022)   [employer] of Occupational Health - Occupational Stress Questionnaire    Feeling of Stress : Not at all  Social Connections: Moderately Integrated (09/30/2022)   Social Connection and Isolation Panel [NHANES]    Frequency of Communication with Friends and Family: More than three times a week    Frequency of Social Gatherings with Friends and Family: More than three times a week    Attends Religious Services: More than 4 times per year    Active Member of [employer] or Organizations: Yes    Attends [employer]  or Organization Meetings: More than 4 times per year    Marital Status: Widowed    Tobacco Counseling Counseling given: Not Answered   Clinical Intake:  Pre-visit preparation completed: Yes  Pain : No/denies pain     BMI - recorded: 26.37 Nutritional Status: BMI 25 -29 Overweight Nutritional Risks: None Diabetes: No  How often do you need to have someone help you when you read instructions, pamphlets, or other written materials from your doctor or pharmacy?: 1 - Never  Diabetic?  No  Interpreter Needed?: No  Information entered by :: Theresa Mulligan LPN   Activities of Daily Living    09/30/2022   10:10 AM 09/29/2022    3:41 PM  In your present state of health, do you have any difficulty performing the following activities:  Hearing? 0 0  Vision? 0 0  Difficulty concentrating or making decisions? 0 1  Walking or climbing stairs? 0 0  Dressing or bathing? 0 0  Doing errands, shopping? 0 0  Preparing Food and eating ? N N  Using the Toilet? N N  In the past six months, have you accidently leaked urine? Y Y  Comment Followed by PCP   Do you have problems with loss of bowel control? N N  Managing your Medications? N N  Managing your Finances? N N  Housekeeping or managing your Housekeeping? N N    Patient Care Team: Eustaquio Boyden, MD as PCP - General (Family Medicine)  Indicate any recent Medical Services you may have received  from other than [facility] providers in the past year (date may be approximate).     Assessment:   This is a routine wellness examination for [place].  Hearing/Vision screen Hearing Screening - Comments:: Denies hearing difficulties   Vision Screening - Comments:: Wears rx glasses - up to date with routine eye exams with  Dr Haskel Khan  Dietary issues and exercise activities discussed: Current Exercise Habits: Home exercise routine, Time (Minutes): 40, Frequency (Times/Week): 6, Weekly Exercise (Minutes/Week): 240, Intensity: Moderate, Exercise limited by: None identified   Goals Addressed               This Visit's Progress     Patient stated (pt-stated)        I want to work on balance and flexibility.       Depression Screen    09/30/2022   10:10 AM 07/03/2022   12:25 PM 09/26/2021    9:05 AM 09/25/2020    8:53 AM 09/25/2019    9:34 AM 09/23/2018    8:27 AM 09/09/2017    8:15 AM  PHQ 2/9 Scores  PHQ - 2 Score 0 0 0 2 2 0 0  PHQ- 9 Score    2 2 0 0    Fall Risk    09/30/2022   10:11 AM 09/29/2022    3:41 PM 09/26/2021    9:12 AM 09/25/2020    8:52 AM 09/25/2019    9:33 AM  Fall Risk   Falls in the past year? 0 0 0 0 0  Number falls in past yr: 0 0 0 0 0  Injury with Fall? 0 0 0 0 0  Risk for fall due to : No Fall Risks  No Fall Risks Impaired balance/gait;Medication side effect No Fall Risks  Follow up Falls prevention discussed   Falls evaluation completed;Falls prevention discussed Falls evaluation completed;Falls prevention discussed    FALL RISK PREVENTION PERTAINING TO THE HOME:  Any stairs in or around  the home? Yes  If so, are there any without handrails? No  Home free of loose throw rugs in walkways, pet beds, electrical cords, etc? Yes  Adequate lighting in your home to reduce risk of falls? Yes   ASSISTIVE DEVICES UTILIZED TO PREVENT FALLS:  Life alert? Yes Use of a cane, walker or w/c? No  Grab bars in the bathroom? Yes  Shower chair or bench in shower?  No  Elevated toilet seat or a handicapped toilet? No   TIMED UP AND GO:  Was the test performed? No . Audio Visit   Cognitive Function:    09/25/2020    8:56 AM 09/25/2019    9:38 AM 09/23/2018    8:33 AM 09/09/2017    8:15 AM  MMSE - Mini Mental State Exam  Orientation to time Orientation to Place Registration Attention/ Calculation 5 5 0 0  Recall Language- name 2 objects   0 0  Language- repeat Language- follow 3 step command   0 3  Language- read & follow direction   0 0  Write a sentence   0 0  Copy design   0 0  Total score   17 20        09/30/2022   10:13 AM 09/26/2021    9:13 AM  6CIT Screen  What Year? 0 points 0 points  What month? 0 points 0 points  What time? 0 points 0 points  Count back from 20 0 points 0 points  Months in reverse 0 points 0 points  Repeat phrase 0 points 0 points  Total Score 0 points 0 points    Immunizations Immunization History  Administered Date(s) Administered   COVID-19, mRNA, vaccine(Comirnaty)12 years and older 11/04/2021, 09/15/2022   Fluad Quad(high Dose 65+) 03/28/2019, 03/13/2020, 04/18/2021, 04/01/2022   H1N1 05/19/2008   Influenza Inj Mdck Quad Pf 04/29/2015   Influenza Split 03/06/2008, 03/27/2011, 04/28/2012, 04/05/2013, 03/21/2014   Influenza,inj,Quad PF,6+ Mos 02/18/2016, 03/03/2017, 03/17/2018   Influenza-Unspecified 04/29/2015   Moderna Covid-19 Vaccine Bivalent Booster 24yrs & up 02/27/2021   Moderna SARS-COV2 Booster Vaccination 04/23/2020, 10/24/2020   Moderna Sars-Covid-2 Vaccination 06/26/2019, 07/24/2019   Pneumococcal Conjugate-13 07/17/2014   Pneumococcal Polysaccharide-23 04/09/2010   Tdap 08/06/2014   Zoster, Live 05/15/2010    TDAP status: Up to date  Flu Vaccine status: Up to date  Pneumococcal vaccine status: Up to date  Covid-19 vaccine status: Completed vaccines  Qualifies for Shingles Vaccine? Yes   Zostavax completed No   Shingrix  Completed?: No.    Education has been provided regarding the importance of this vaccine. Patient has been advised to call insurance company to determine out of pocket expense if they have not yet received this vaccine. Advised may also receive vaccine at local pharmacy or Health Dept. Verbalized acceptance and understanding.  Screening Tests Health Maintenance  Topic Date Due   Zoster Vaccines- Shingrix (1 of 2) 12/30/2022 (Originally [birth year]29[birth year]1983)   INFLUENZA VACCINE  01/07/2023   Medicare Annual Wellness (AWV)  09/30/2023   DTaP/Tdap/Td (2 - Td or Tdap) 08/05/2024   Pneumonia Vaccine 72+ Years old  Completed   COVID-19 Vaccine  Completed   HPV VACCINES  Aged Out    Health Maintenance  There are no preventive care reminders to display for this patient.   Colorectal cancer screening: No longer required.   Lung Cancer  Screening: (Low Dose CT Chest recommended if Age 72-80 years, 30 pack-year currently smoking OR have quit w/in 15years.) does not qualify.     Additional Screening:  Hepatitis C Screening: does not qualify; Completed   Vision Screening: Recommended annual ophthalmology exams for early detection of glaucoma and other disorders of the eye. Is the patient up to date with their annual eye exam?  Yes  Who is the provider or what is the name of the office in which the patient attends annual eye exams? Dr Haskel Khan If pt is not established with a provider, would they like to be referred to a provider to establish care? No .   Dental Screening: Recommended annual dental exams for proper oral hygiene  Community Resource Referral / Chronic Care Management:  CRR required this visit?  No   CCM required this visit?  No      Plan:     I have personally reviewed and noted the following in the patient's chart:   Medical and social history Use of alcohol, tobacco or illicit drugs  Current medications and supplements including opioid prescriptions. Patient is not currently  taking opioid prescriptions. Functional ability and status Nutritional status Physical activity Advanced directives List of other physicians Hospitalizations, surgeries, and ER visits in previous 12 months Vitals Screenings to include cognitive, depression, and falls Referrals and appointments  In addition, I have reviewed and discussed with patient certain preventive protocols, quality metrics, and best practice recommendations. A written personalized care plan for preventive services as well as general preventive health recommendations were provided to patient.     Tillie Rung, LPN   1/61/0960   Nurse Notes: None

## 2022-09-30 NOTE — Patient Instructions (Addendum)
Harry Ray , Thank you for taking time to come for your Medicare Wellness Visit. I appreciate your ongoing commitment to your health goals. Please review the following plan we discussed and let me know if I can assist you in the future.   These are the goals we discussed:  Goals       Increase physical activity (pt-stated)      Patient Stated      Starting 09/23/18, I will continue take medications as prescribed.       Patient Stated      09/25/2019, I will continue walking everyday for about 2 miles.      Patient Stated      09/25/2020, I will continue to walk 2-3 days a week for 1 mile.       Patient stated (pt-stated)      I want to work on balance and flexibility.        This is a list of the screening recommended for you and due dates:  Health Maintenance  Topic Date Due   Zoster (Shingles) Vaccine (1 of 2) 12/30/2022*   Flu Shot  01/07/2023   Medicare Annual Wellness Visit  09/30/2023   DTaP/Tdap/Td vaccine (2 - Td or Tdap) 08/05/2024   Pneumonia Vaccine  Completed   COVID-19 Vaccine  Completed   HPV Vaccine  Aged Out  *Topic was postponed. The date shown is not the original due date.    Advanced directives: Please bring a copy of your health care power of attorney and living will to the office to be added to your chart at your convenience.   Conditions/risks identified: None  Next appointment: Follow up in one year for your annual wellness visit.    Preventive Care 53 Years and Older, Male  Preventive care refers to lifestyle choices and visits with your health care provider that can promote health and wellness. What does preventive care include? A yearly physical exam. This is also called an annual well check. Dental exams once or twice a year. Routine eye exams. Ask your health care provider how often you should have your eyes checked. Personal lifestyle choices, including: Daily care of your teeth and gums. Regular physical activity. Eating a healthy  diet. Avoiding tobacco and drug use. Limiting alcohol use. Practicing safe sex. Taking low doses of aspirin every day. Taking vitamin and mineral supplements as recommended by your health care provider. What happens during an annual well check? The services and screenings done by your health care provider during your annual well check will depend on your age, overall health, lifestyle risk factors, and family history of disease. Counseling  Your health care provider may ask you questions about your: Alcohol use. Tobacco use. Drug use. Emotional well-being. Home and relationship well-being. Sexual activity. Eating habits. History of falls. Memory and ability to understand (cognition). Work and work Astronomer. Screening  You may have the following tests or measurements: Height, weight, and BMI. Blood pressure. Lipid and cholesterol levels. These may be checked every 5 years, or more frequently if you are over 35 years old. Skin check. Lung cancer screening. You may have this screening every year starting at age 90 if you have a 30-pack-year history of smoking and currently smoke or have quit within the past 15 years. Fecal occult blood test (FOBT) of the stool. You may have this test every year starting at age 16. Flexible sigmoidoscopy or colonoscopy. You may have a sigmoidoscopy every 5 years or a colonoscopy every 10  years starting at age 47. Prostate cancer screening. Recommendations will vary depending on your family history and other risks. Hepatitis C blood test. Hepatitis B blood test. Sexually transmitted disease (STD) testing. Diabetes screening. This is done by checking your blood sugar (glucose) after you have not eaten for a while (fasting). You may have this done every 1-3 years. Abdominal aortic aneurysm (AAA) screening. You may need this if you are a current or former smoker. Osteoporosis. You may be screened starting at age 89 if you are at high risk. Talk with  your health care provider about your test results, treatment options, and if necessary, the need for more tests. Vaccines  Your health care provider may recommend certain vaccines, such as: Influenza vaccine. This is recommended every year. Tetanus, diphtheria, and acellular pertussis (Tdap, Td) vaccine. You may need a Td booster every 10 years. Zoster vaccine. You may need this after age 42. Pneumococcal 13-valent conjugate (PCV13) vaccine. One dose is recommended after age 61. Pneumococcal polysaccharide (PPSV23) vaccine. One dose is recommended after age 76. Talk to your health care provider about which screenings and vaccines you need and how often you need them. This information is not intended to replace advice given to you by your health care provider. Make sure you discuss any questions you have with your health care provider. Document Released: 06/21/2015 Document Revised: 02/12/2016 Document Reviewed: 03/26/2015 Elsevier Interactive Patient Education  2017 [employer].  Fall Prevention in the Home Falls can cause injuries. They can happen to people of all ages. There are many things you can do to make your home safe and to help prevent falls. What can I do on the outside of my home? Regularly fix the edges of walkways and driveways and fix any cracks. Remove anything that might make you trip as you walk through a door, such as a raised step or threshold. Trim any bushes or trees on the path to your home. Use bright outdoor lighting. Clear any walking paths of anything that might make someone trip, such as rocks or tools. Regularly check to see if handrails are loose or broken. Make sure that both sides of any steps have handrails. Any raised decks and porches should have guardrails on the edges. Have any leaves, snow, or ice cleared regularly. Use sand or salt on walking paths during winter. Clean up any spills in your garage right away. This includes oil or grease spills. What  can I do in the bathroom? Use night lights. Install grab bars by the toilet and in the tub and shower. Do not use towel bars as grab bars. Use non-skid mats or decals in the tub or shower. If you need to sit down in the shower, use a plastic, non-slip stool. Keep the floor dry. Clean up any water that spills on the floor as soon as it happens. Remove soap buildup in the tub or shower regularly. Attach bath mats securely with double-sided non-slip rug tape. Do not have throw rugs and other things on the floor that can make you trip. What can I do in the bedroom? Use night lights. Make sure that you have a light by your bed that is easy to reach. Do not use any sheets or blankets that are too big for your bed. They should not hang down onto the floor. Have a firm chair that has side arms. You can use this for support while you get dressed. Do not have throw rugs and other things on the floor  that can make you trip. What can I do in the kitchen? Clean up any spills right away. Avoid walking on wet floors. Keep items that you use a lot in easy-to-reach places. If you need to reach something above you, use a strong step stool that has a grab bar. Keep electrical cords out of the way. Do not use floor polish or wax that makes floors slippery. If you must use wax, use non-skid floor wax. Do not have throw rugs and other things on the floor that can make you trip. What can I do with my stairs? Do not leave any items on the stairs. Make sure that there are handrails on both sides of the stairs and use them. Fix handrails that are broken or loose. Make sure that handrails are as long as the stairways. Check any carpeting to make sure that it is firmly attached to the stairs. Fix any carpet that is loose or worn. Avoid having throw rugs at the top or bottom of the stairs. If you do have throw rugs, attach them to the floor with carpet tape. Make sure that you have a light switch at the top of the  stairs and the bottom of the stairs. If you do not have them, ask someone to add them for you. What else can I do to help prevent falls? Wear shoes that: Do not have high heels. Have rubber bottoms. Are comfortable and fit you well. Are closed at the toe. Do not wear sandals. If you use a stepladder: Make sure that it is fully opened. Do not climb a closed stepladder. Make sure that both sides of the stepladder are locked into place. Ask someone to hold it for you, if possible. Clearly mark and make sure that you can see: Any grab bars or handrails. First and last steps. Where the edge of each step is. Use tools that help you move around (mobility aids) if they are needed. These include: Canes. Walkers. Scooters. Crutches. Turn on the lights when you go into a dark area. Replace any light bulbs as soon as they burn out. Set up your furniture so you have a clear path. Avoid moving your furniture around. If any of your floors are uneven, fix them. If there are any pets around you, be aware of where they are. Review your medicines with your doctor. Some medicines can make you feel dizzy. This can increase your chance of falling. Ask your doctor what other things that you can do to help prevent falls. This information is not intended to replace advice given to you by your health care provider. Make sure you discuss any questions you have with your health care provider. Document Released: 03/21/2009 Document Revised: 10/31/2015 Document Reviewed: 06/29/2014 Elsevier Interactive Patient Education  2017 [employer].

## 2022-11-05 ENCOUNTER — Encounter: Payer: Self-pay | Admitting: Emergency Medicine

## 2022-11-11 ENCOUNTER — Encounter: Payer: Self-pay | Admitting: Student

## 2022-11-11 ENCOUNTER — Ambulatory Visit: Payer: Medicare (Managed Care) | Admitting: Student

## 2022-11-11 VITALS — BP 124/68 | HR 84 | Temp 97.9°F | Ht 68.5 in | Wt 178.0 lb

## 2022-11-11 DIAGNOSIS — R58 Hemorrhage, not elsewhere classified: Secondary | ICD-10-CM

## 2022-11-11 NOTE — Progress Notes (Signed)
[facility] clinic [facility].  Provider: Dr. Earnestine Mealing  Code Status: Full Code Goals of Care:     11/11/2022    2:58 PM  Advanced Directives  Does Patient Have a Medical Advance Directive? Yes  Type of Estate agent of [place];Living will  Does patient want to make changes to medical advance directive? No - Patient declined  Copy of Healthcare Power of Attorney in Chart? No - copy requested     Chief Complaint  Patient presents with   Acute Visit    Spot on Left Arm    HPI: Patient is a 89 y.o. male seen today for an acute visit for Spot on Left Arm  He was on prednisone for 6 months. He has polymyalgia and was on a long taper   Noticed the area on his left arm   He has never bruised easily. He has has a few bruises other places on his arms. Since helping his daughter move.   He just started using some whey protein.   He has been here since Nov 26, 2015. His wife died in 11-26-18 in surgery at [facility].   Past Medical History:  Diagnosis Date   Arthritis    pinkys bilaterally and left thumb   Carotid stenosis 10/07/2016   LICA 40-59%, RICA 1-39%, rpt 1 yr (09/2016)   Heart murmur    minor   History of asthma 1983   asthma attack as reaction to allergy medication (2d ICU stay)   History of chicken pox    Nephritis childhood   Seasonal allergies    spring   Urine incontinence     Past Surgical History:  Procedure Laterality Date   COLONOSCOPY  08/07/2005   Diverticulosis (Colon in [place])   SCROTAL SURGERY     ? benign cyst removal    No Known Allergies  Outpatient Encounter Medications as of 11/11/2022  Medication Sig   cetirizine (ZYRTEC) 10 MG tablet Take 10 mg by mouth daily as needed for allergies. For seasonal allergies   Cholecalciferol (VITAMIN D3) 25 MCG (1000 UT) CAPS Take 1 capsule (1,000 Units total) by mouth daily.   finasteride (PROSCAR) 5 MG tablet TAKE 1 TABLET BY MOUTH EVERY DAY   fluticasone (FLONASE) 50 MCG/ACT nasal  spray Place 2 sprays into both nostrils daily as needed for allergies or rhinitis.   Lifitegrast (XIIDRA) 5 % SOLN    Multiple Vitamins-Minerals (ICAPS AREDS 2 PO) Take 1 tablet by mouth 2 (two) times daily.   Polyethyl Glycol-Propyl Glycol (SYSTANE OP) Apply to eye.   tamsulosin (FLOMAX) 0.4 MG CAPS capsule TAKE 1 CAPSULE(0.4 MG) BY MOUTH DAILY   [DISCONTINUED] predniSONE (DELTASONE) 1 MG tablet TAKE 3 TABLETS BY MOUTH X 30 DAYS, 2 TABLETS X 30 DAYS, 1 TABLET X 30DAYS then stop   No facility-administered encounter medications on file as of 11/11/2022.    Review of Systems:  Review of Systems  Health Maintenance  Topic Date Due   Zoster Vaccines- Shingrix (1 of 2) 12/30/2022 (Originally 08/29/1983)   INFLUENZA VACCINE  01/07/2023   Medicare Annual Wellness (AWV)  09/30/2023   DTaP/Tdap/Td (2 - Td or Tdap) 08/05/2024   Pneumonia Vaccine 43+ Years old  Completed   COVID-19 Vaccine  Completed   HPV VACCINES  Aged Out    Physical Exam: Vitals:   11/11/22 1455  BP: 124/68  Pulse: 84  Temp: 97.9 F (36.6 C)  SpO2: 99%  Weight: 178 lb (80.7 kg)  Height: 5' 8.5" (  1.74 m)   Body mass index is 26.67 kg/m. Physical Exam Constitutional:      Appearance: Normal appearance.  Skin:    Comments: Image as below measures 2.5x3cm. Notable skin thinning in the area.   Neurological:     Mental Status: He is alert.     Labs reviewed: Basic Metabolic Panel: Recent Labs    03/25/22 0725  NA 139  K 3.7  CL 102  CO2 30  GLUCOSE 163*  BUN 23  CREATININE 1.02  CALCIUM 9.2   Liver Function Tests: Recent Labs    03/25/22 0725  AST 12  ALT 15  ALKPHOS 41  BILITOT 0.7  PROT 6.3  ALBUMIN 3.9   No results for input(s): "LIPASE", "AMYLASE" in the last 8760 hours. No results for input(s): "AMMONIA" in the last 8760 hours. CBC: No results for input(s): "WBC", "NEUTROABS", "HGB", "HCT", "MCV", "PLT" in the last 8760 hours. Lipid Panel: Recent Labs    03/25/22 0725  CHOL 175   HDL 62.70  LDLCALC 90  TRIG 113.0  CHOLHDL 3   Lab Results  Component Value Date   HGBA1C 5.7 (A) 04/01/2022    Procedures since last visit: No results found.  Assessment/Plan Ecchymosis Patient presents with what appears to be a bruise. Patient recently completed a prolonged taper of prednisone. Notable skin thinning in the area of the skin changes. Measured today for easy monitoring. If there are additional changes could consider biopsy, however, not appropriate at this time given acuity of skin changes. Recommend PCP collect CBC at next appointment to assess platelets and hemoglobin.    Labs/tests ordered:  * No order type specified * Next appt:  Visit date not found

## 2022-11-18 DIAGNOSIS — D2262 Melanocytic nevi of left upper limb, including shoulder: Secondary | ICD-10-CM | POA: Diagnosis not present

## 2022-11-18 DIAGNOSIS — Z85828 Personal history of other malignant neoplasm of skin: Secondary | ICD-10-CM | POA: Diagnosis not present

## 2022-11-18 DIAGNOSIS — L538 Other specified erythematous conditions: Secondary | ICD-10-CM | POA: Diagnosis not present

## 2022-11-18 DIAGNOSIS — L820 Inflamed seborrheic keratosis: Secondary | ICD-10-CM | POA: Diagnosis not present

## 2022-11-18 DIAGNOSIS — D485 Neoplasm of uncertain behavior of skin: Secondary | ICD-10-CM | POA: Diagnosis not present

## 2022-11-18 DIAGNOSIS — D225 Melanocytic nevi of trunk: Secondary | ICD-10-CM | POA: Diagnosis not present

## 2022-11-18 DIAGNOSIS — Z08 Encounter for follow-up examination after completed treatment for malignant neoplasm: Secondary | ICD-10-CM | POA: Diagnosis not present

## 2022-11-18 DIAGNOSIS — D2261 Melanocytic nevi of right upper limb, including shoulder: Secondary | ICD-10-CM | POA: Diagnosis not present

## 2022-11-18 DIAGNOSIS — D2272 Melanocytic nevi of left lower limb, including hip: Secondary | ICD-10-CM | POA: Diagnosis not present

## 2022-11-18 DIAGNOSIS — D0439 Carcinoma in situ of skin of other parts of face: Secondary | ICD-10-CM | POA: Diagnosis not present

## 2022-11-18 DIAGNOSIS — L570 Actinic keratosis: Secondary | ICD-10-CM | POA: Diagnosis not present

## 2022-12-05 ENCOUNTER — Encounter: Payer: Self-pay | Admitting: Family Medicine

## 2022-12-05 DIAGNOSIS — M85851 Other specified disorders of bone density and structure, right thigh: Secondary | ICD-10-CM

## 2022-12-07 NOTE — Telephone Encounter (Signed)
Do not see order in. Ok to place and give patient contact number to schedule?

## 2022-12-09 NOTE — Addendum Note (Signed)
Addended by: Eustaquio Boyden on: 12/09/2022 05:40 PM   Modules accepted: Orders

## 2023-01-12 ENCOUNTER — Encounter: Payer: Self-pay | Admitting: Family Medicine

## 2023-01-19 ENCOUNTER — Encounter: Payer: Self-pay | Admitting: Family Medicine

## 2023-01-19 NOTE — Telephone Encounter (Signed)
Replied via other message.  

## 2023-01-27 DIAGNOSIS — C44329 Squamous cell carcinoma of skin of other parts of face: Secondary | ICD-10-CM | POA: Diagnosis not present

## 2023-01-27 DIAGNOSIS — D2339 Other benign neoplasm of skin of other parts of face: Secondary | ICD-10-CM | POA: Diagnosis not present

## 2023-02-03 IMAGING — DX DG SHOULDER 2+V*R*
3 series · 3 of 3 positions shown · non-contrast
Comparison: Left shoulder series same day.  Chest x-ray 09/18/2020.

CLINICAL DATA: History of shoulder pain.

EXAM:
RIGHT SHOULDER - 2+ VIEW

[shoulder (grashey) ap]
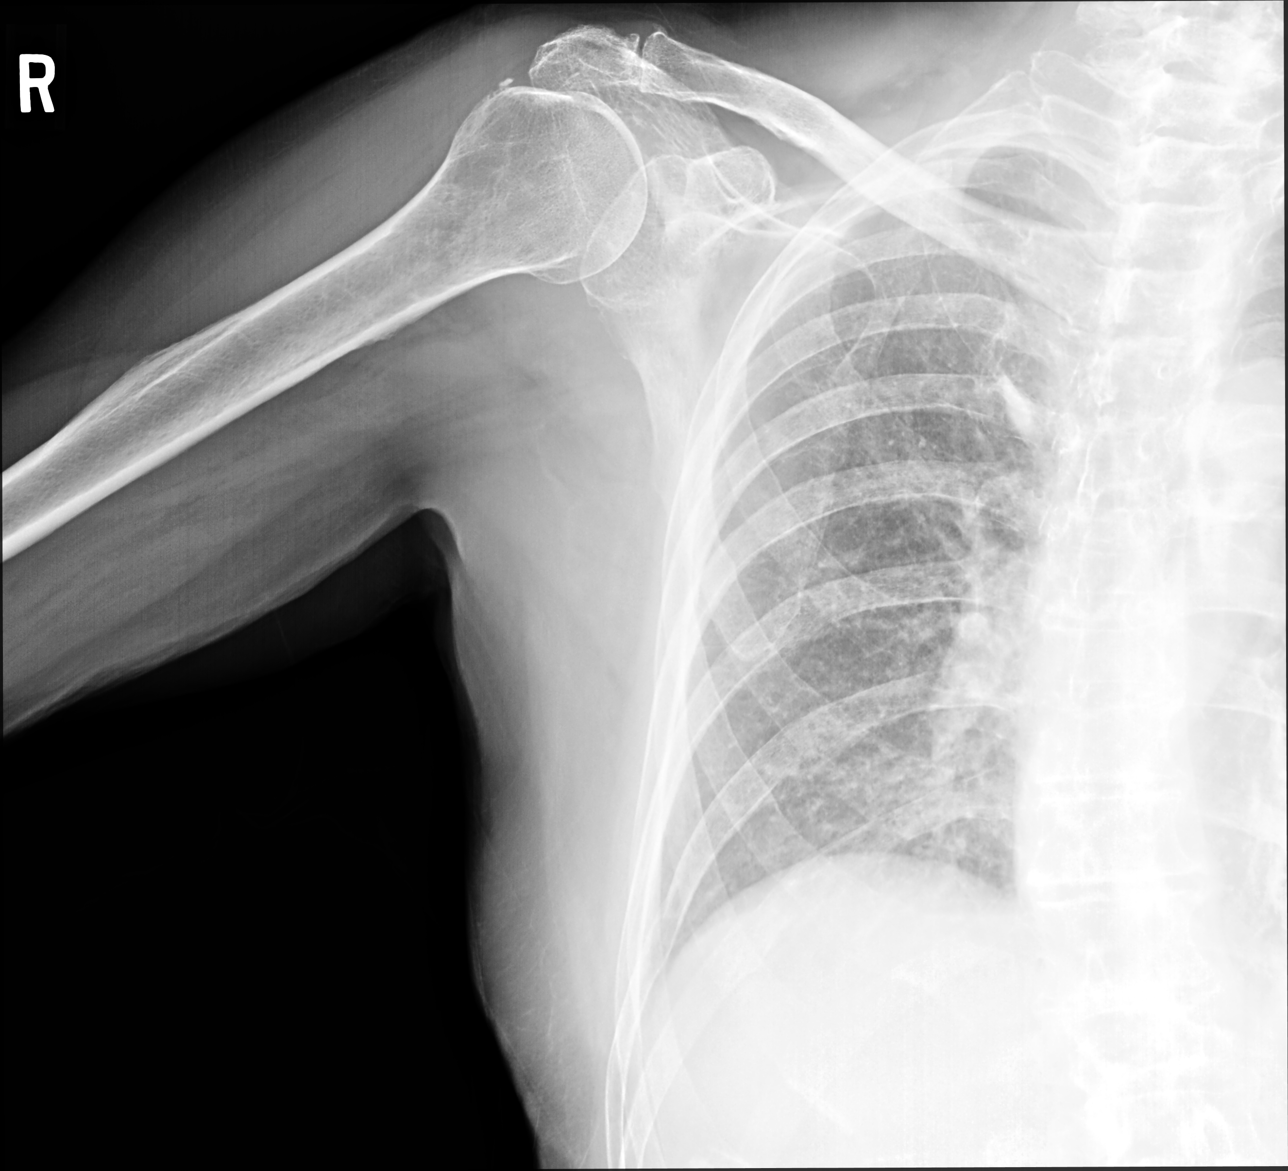

[shoulder y view]
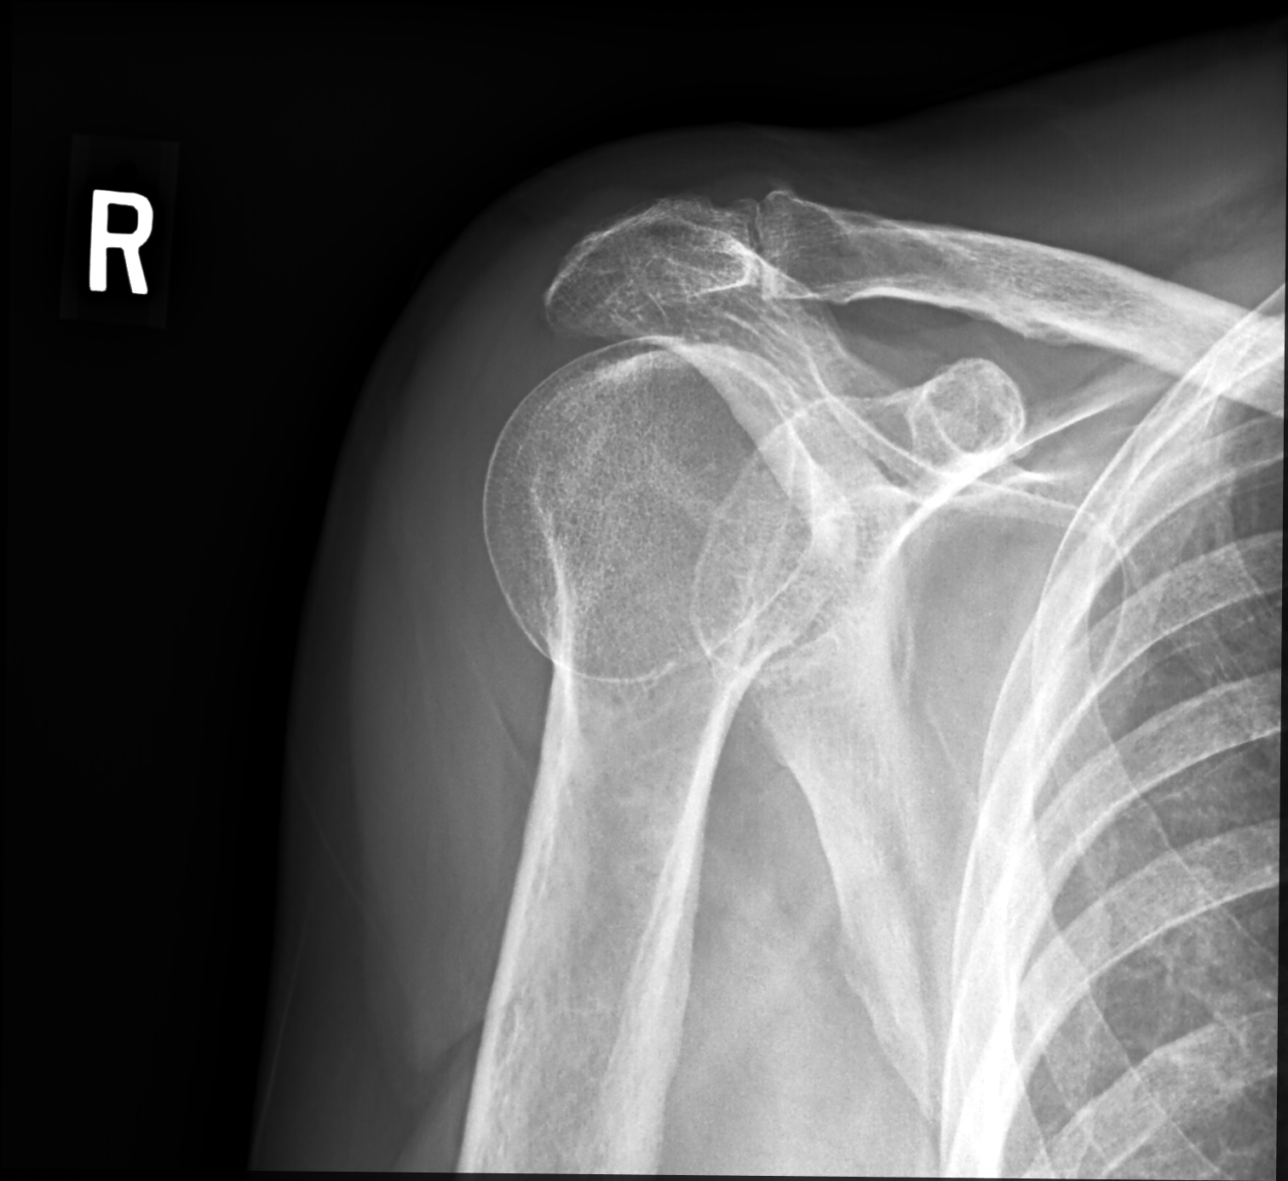

[shoulder axillary pa]
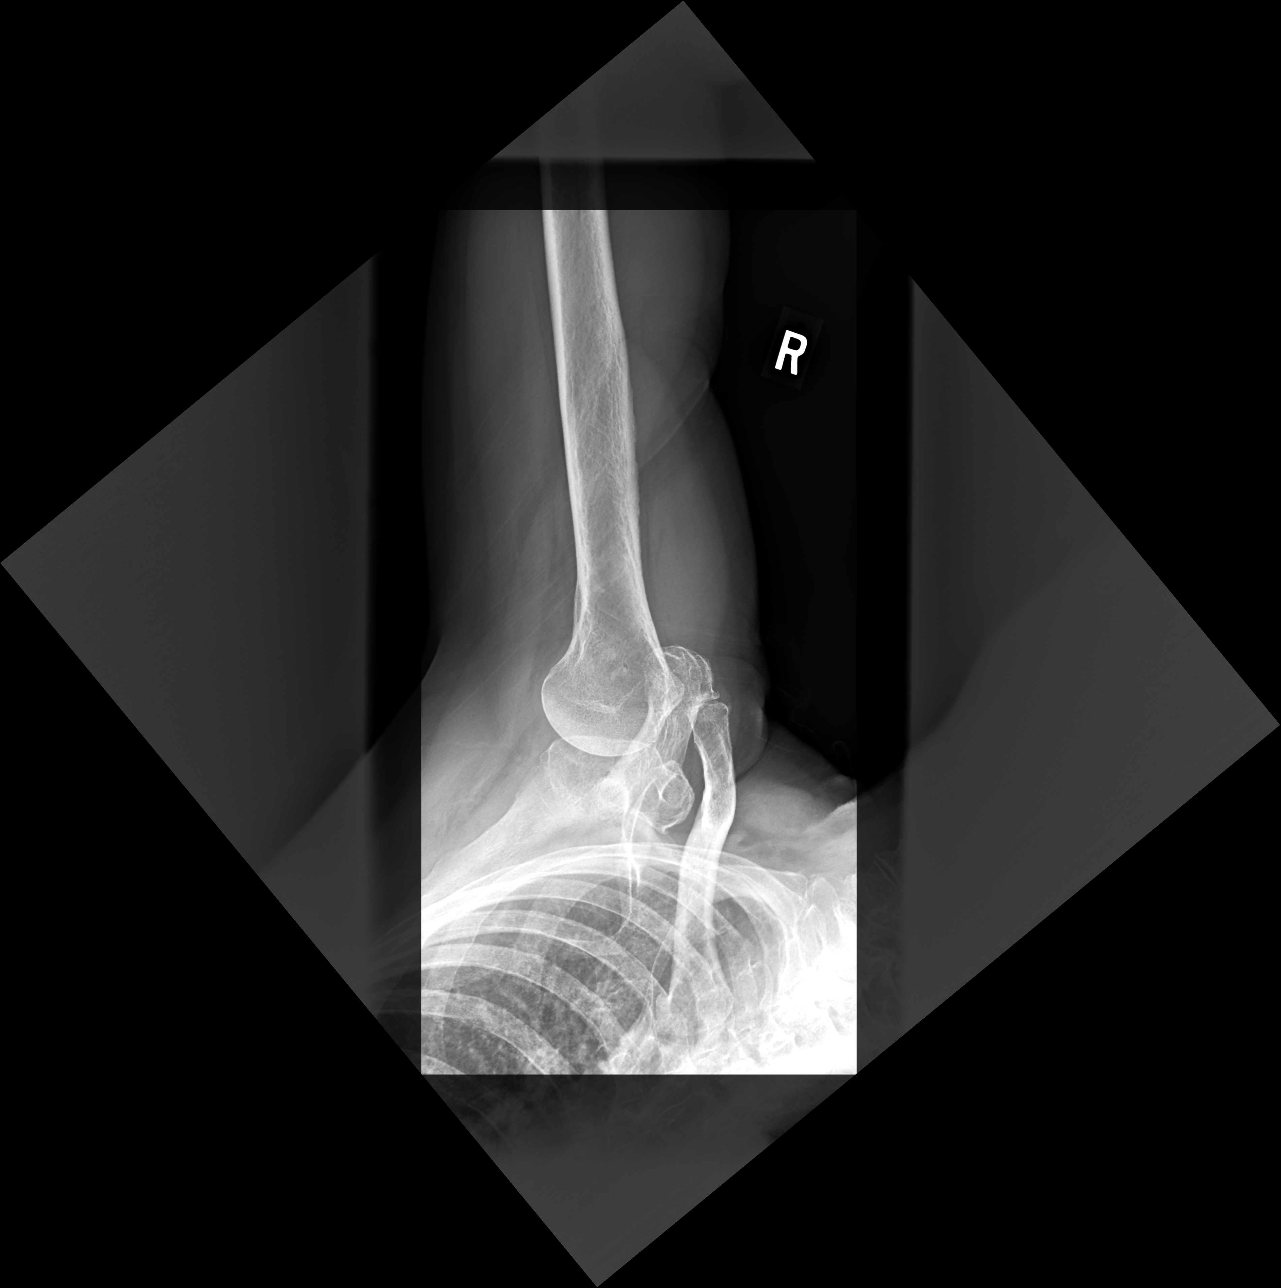

[3 of 3 positions shown; findings below may reference images not displayed]

FINDINGS: Acromioclavicular glenohumeral degenerative change. Calcific density
noted of the distal supraspinatus tendon consistent calcific
supraspinatus tendinosis. Degenerative changes scoliosis thoracic
spine. No acute bony or joint abnormality. No evidence of fracture,
dislocation, or separation.
IMPRESSION: 1. Acromioclavicular glenohumeral degenerative change. No acute bony
or joint abnormality.

2.  Calcific supraspinatus tendinosis.

## 2023-02-03 IMAGING — DX DG SHOULDER 2+V*L*
3 series · 3 of 3 positions shown · non-contrast
Comparison: Chest x-ray 09/18/2020.

CLINICAL DATA: History of shoulder pain.

EXAM:
LEFT SHOULDER - 2+ VIEW

[shoulder (grashey) ap]
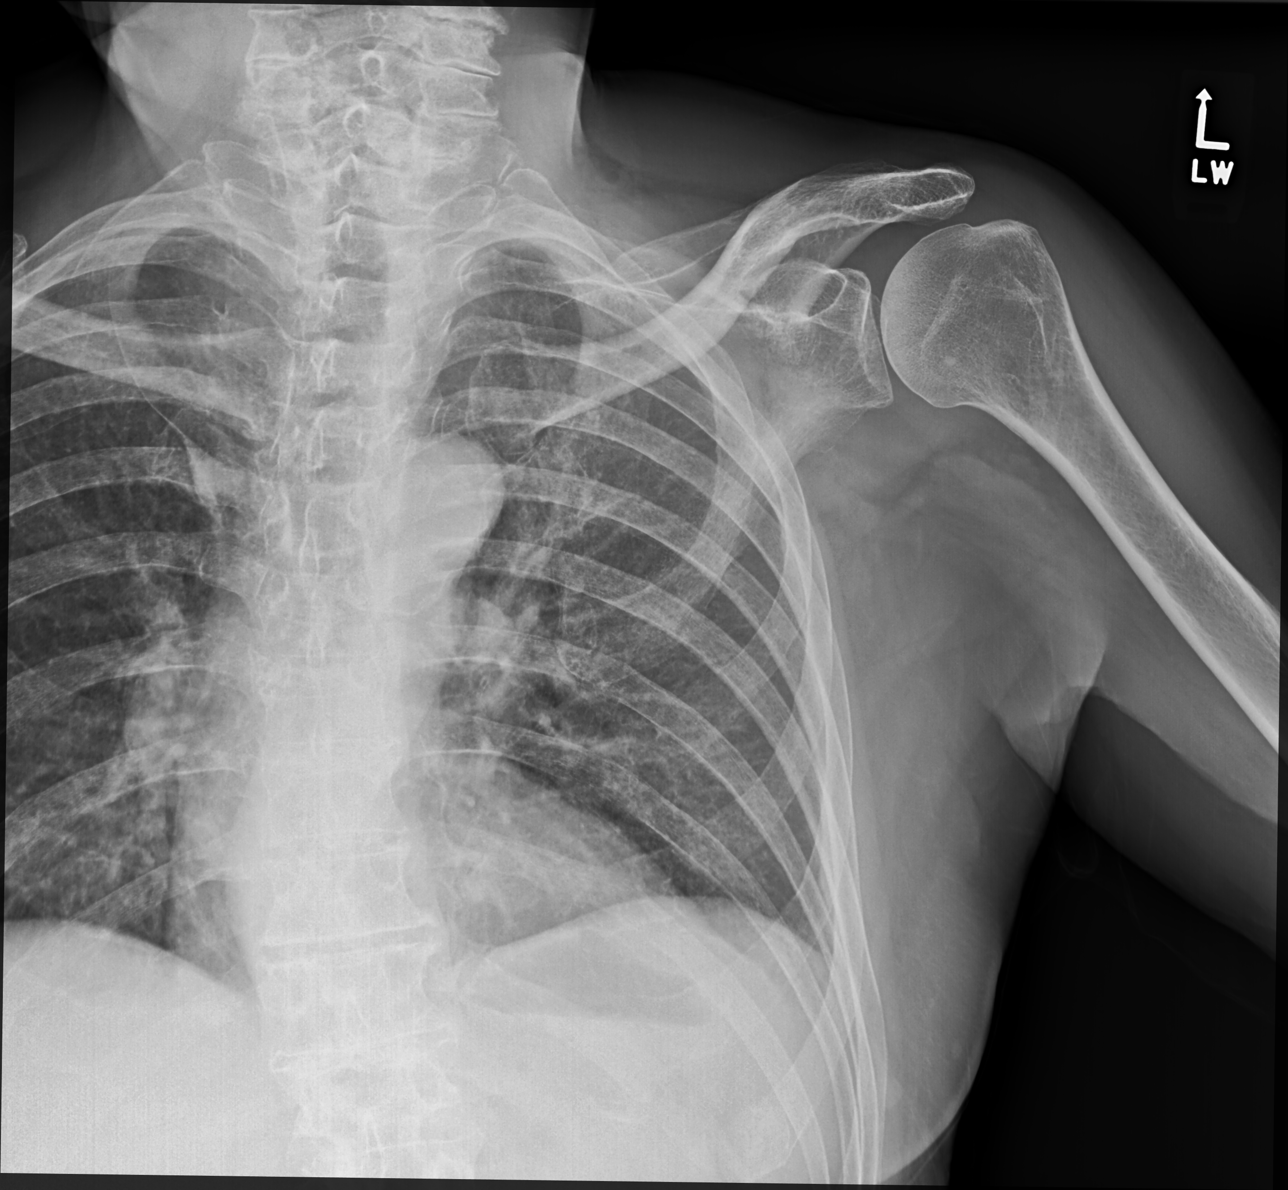

[shoulder y view]
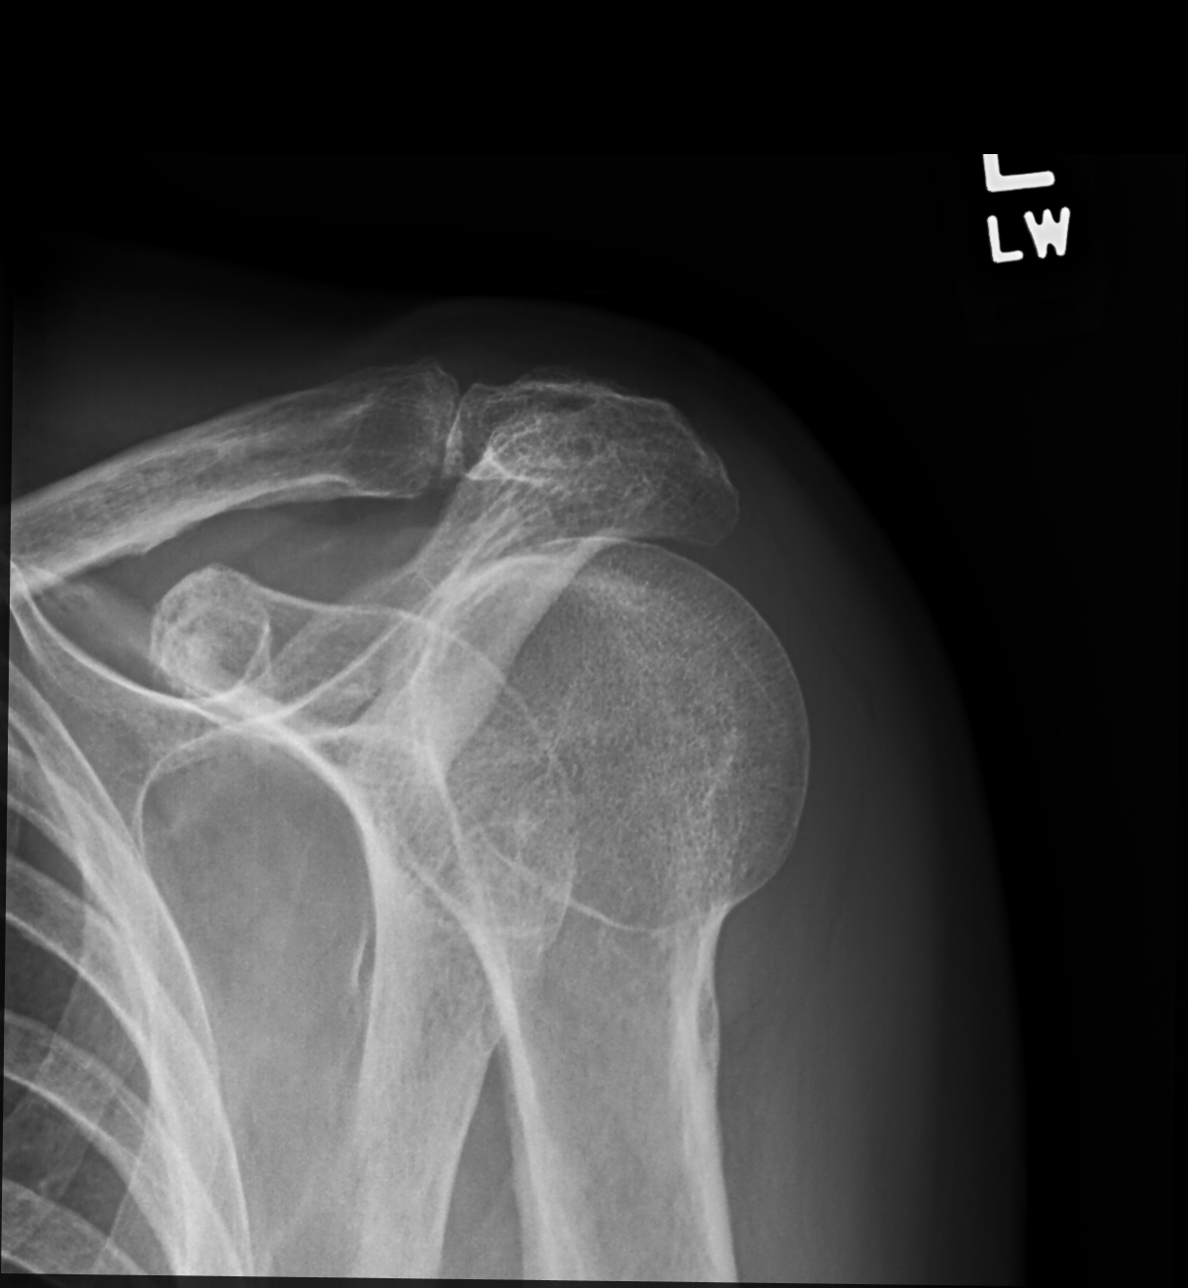

[shoulder axillary pa]
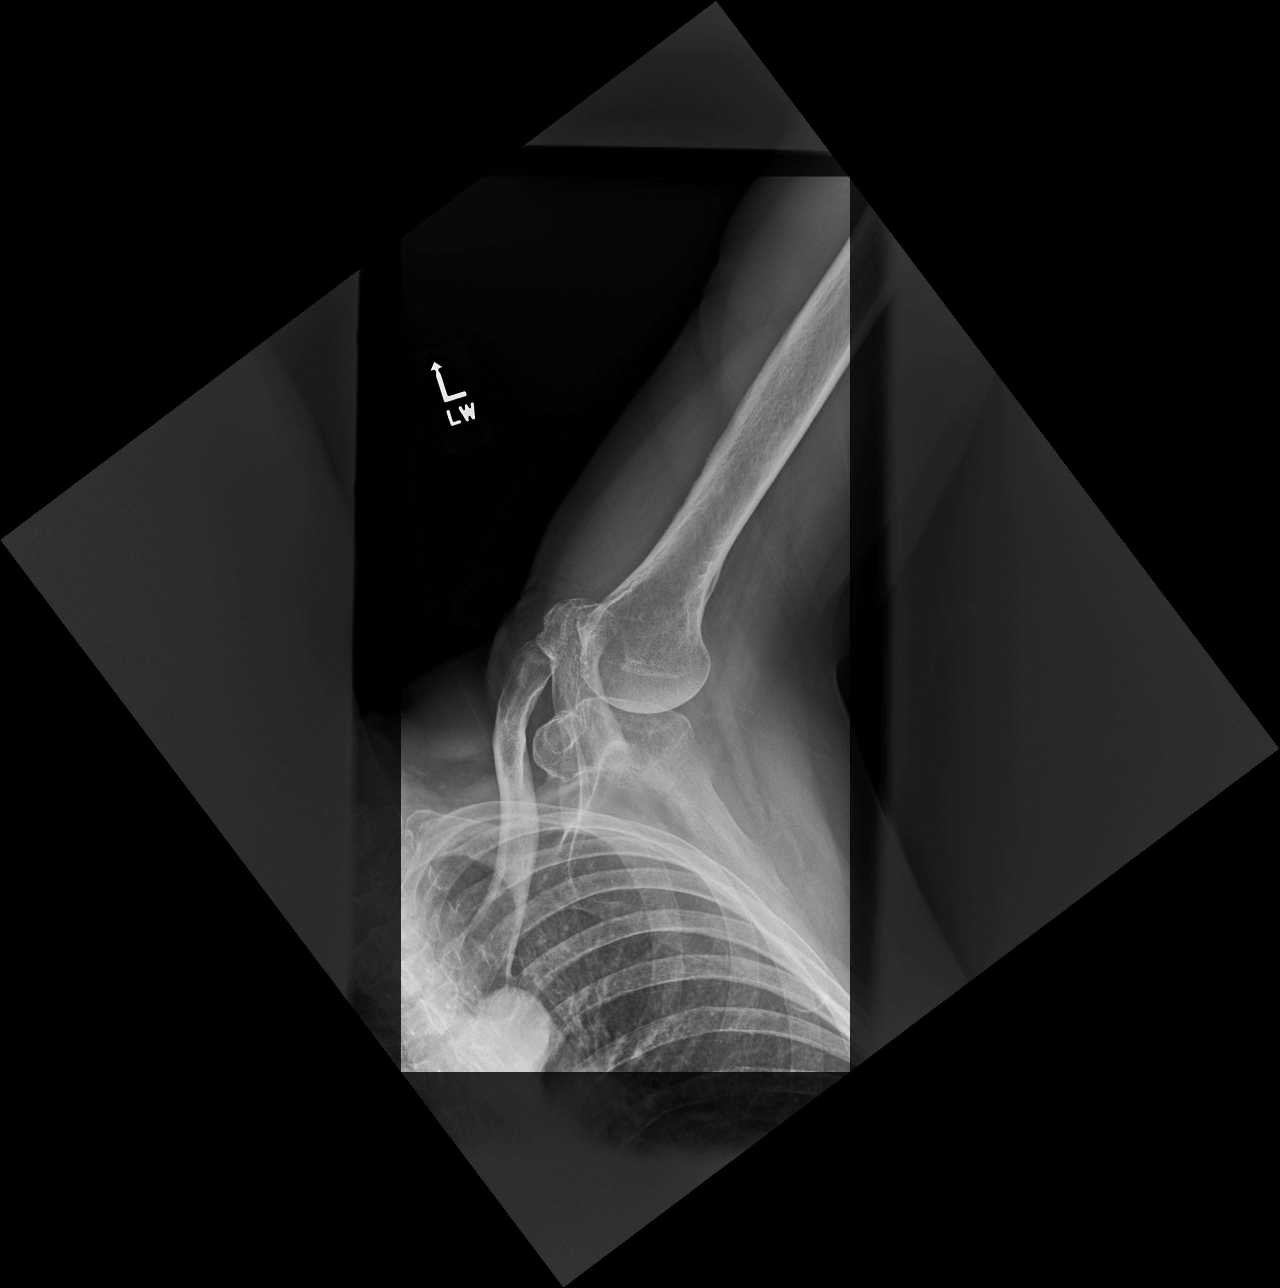

[3 of 3 positions shown; findings below may reference images not displayed]

FINDINGS: Acromioclavicular and glenohumeral degenerative change. No evidence
of fracture or dislocation. Punctate sclerotic foci noted over the
proximal left humerus most likely bone islands. Questionable slight
increase in sclerosis noted over the scapula just adjacent to the
glenoid. To exclude a focal blastic lesion MRI of the left
shoulder/scapula suggested.
IMPRESSION: 1. Acromioclavicular glenohumeral degenerative change. No acute bony
abnormality identified.

2. Questionable slight increase in sclerosis noted over the scapula
just adjacent to the glenoid. To exclude a focal blastic lesion MRI
of the left shoulder/scapula suggested.

## 2023-02-10 ENCOUNTER — Ambulatory Visit
Admission: RE | Admit: 2023-02-10 | Discharge: 2023-02-10 | Disposition: A | Discharge status: Home / Self Care | Payer: Medicare (Managed Care) | Source: Ambulatory Visit | Attending: Family Medicine | Admitting: Family Medicine

## 2023-02-10 DIAGNOSIS — M85851 Other specified disorders of bone density and structure, right thigh: Secondary | ICD-10-CM | POA: Diagnosis not present

## 2023-02-10 DIAGNOSIS — M85852 Other specified disorders of bone density and structure, left thigh: Secondary | ICD-10-CM | POA: Diagnosis not present

## 2023-03-02 DIAGNOSIS — L853 Xerosis cutis: Secondary | ICD-10-CM | POA: Diagnosis not present

## 2023-03-02 DIAGNOSIS — L309 Dermatitis, unspecified: Secondary | ICD-10-CM | POA: Diagnosis not present

## 2023-03-03 IMAGING — CT CT SHOULDER*L* W/O CM
3 of 6 series · 12 of 36 positions shown, 14 images · non-contrast
Comparison: Plain films left shoulder 06/17/2021.

CLINICAL DATA: Bilateral shoulder pain.  No known injury.

EXAM:
CT OF THE UPPER LEFT EXTREMITY WITHOUT CONTRAST
TECHNIQUE: Multidetector CT imaging of the upper left extremity was performed
according to the standard protocol.
RADIATION DOSE REDUCTION: This exam was performed according to the
departmental dose-optimization program which includes automated
exposure control, adjustment of the mA and/or kV according to
patient size and/or use of iterative reconstruction technique.

[Series 2: shoulder 2.00 ax · axial · 0.50mm/px · z∈[-1049,-925]mm · 5 of 99 slices shown, 7 images]
[im 17/99  soft-tissue]
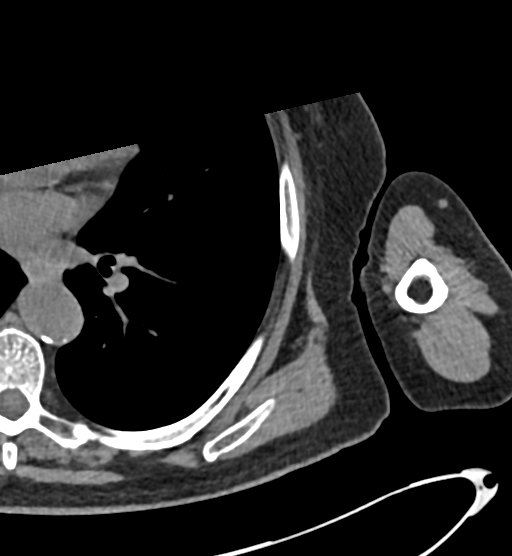
[im 17/99  bone]
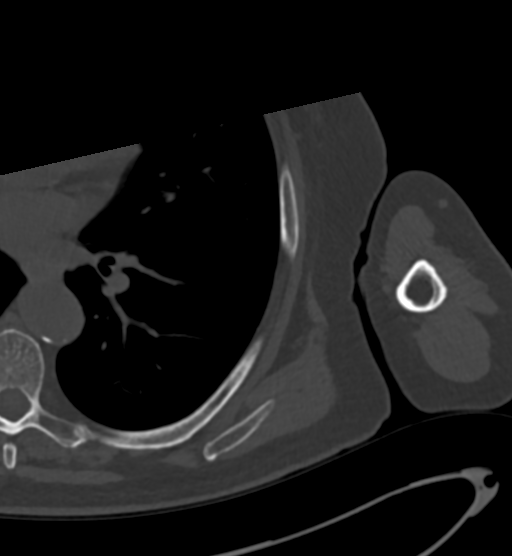
[im 33/99  bone]
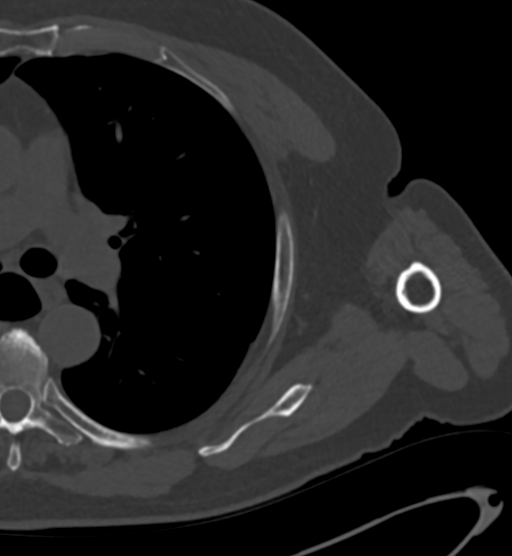
[im 50/99  bone]
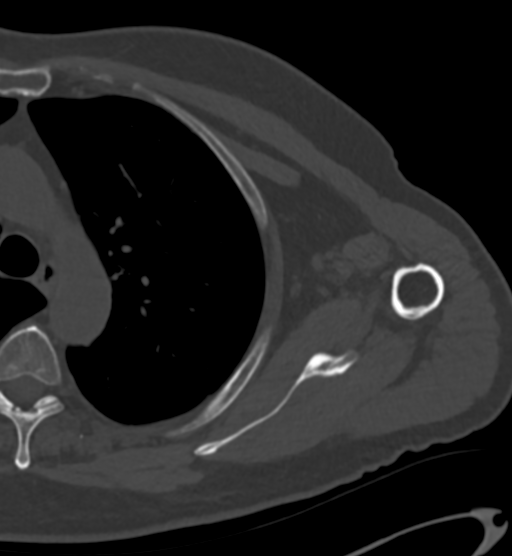
[im 66/99  bone]
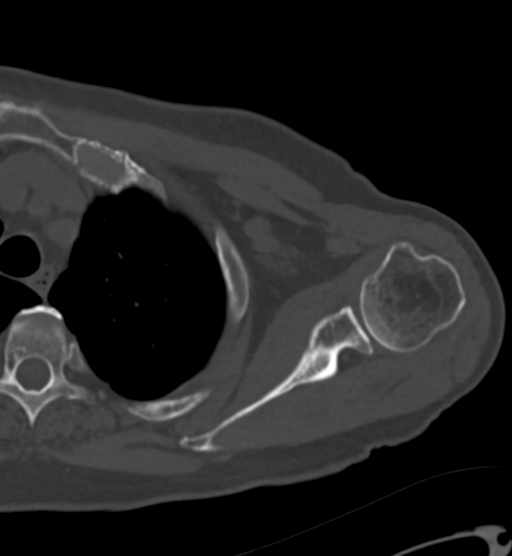
[im 82/99  soft-tissue]
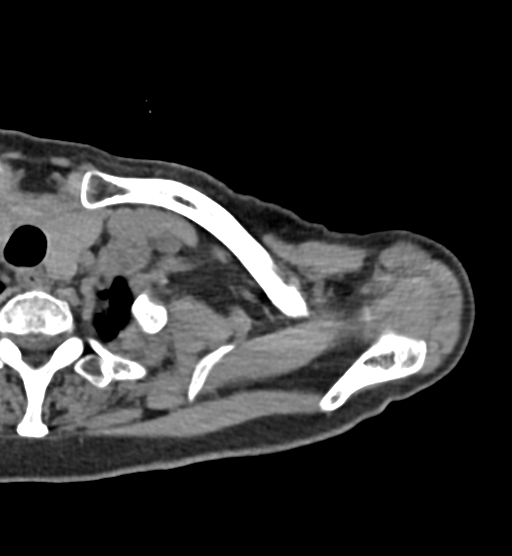
[im 82/99  bone]
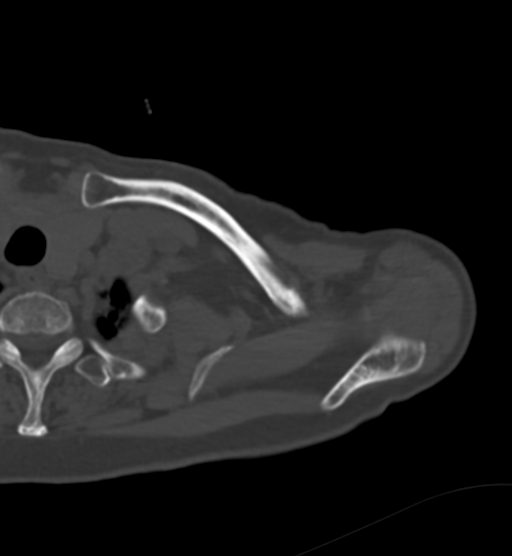

[Series 6: shoulder 2.00 cor · coronal · 0.39mm/px · 1 of 69 slices shown]
[im 35/69  bone]
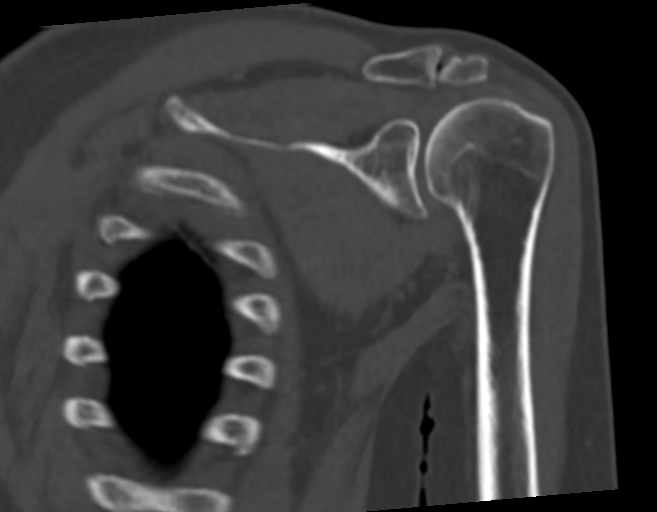

[Series 10: shoulder 2.00 sag · sagittal · 0.24mm/px · 6 of 103 slices shown]
[im 18/103  bone]
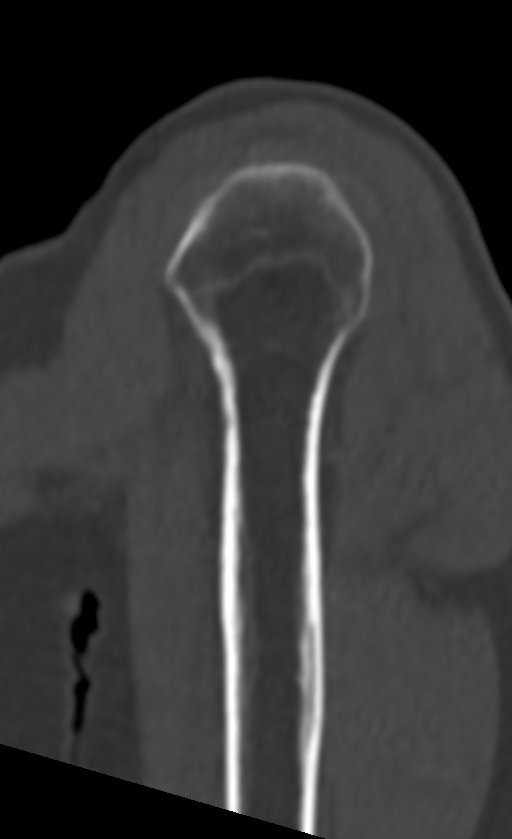
[im 29/103  soft-tissue]
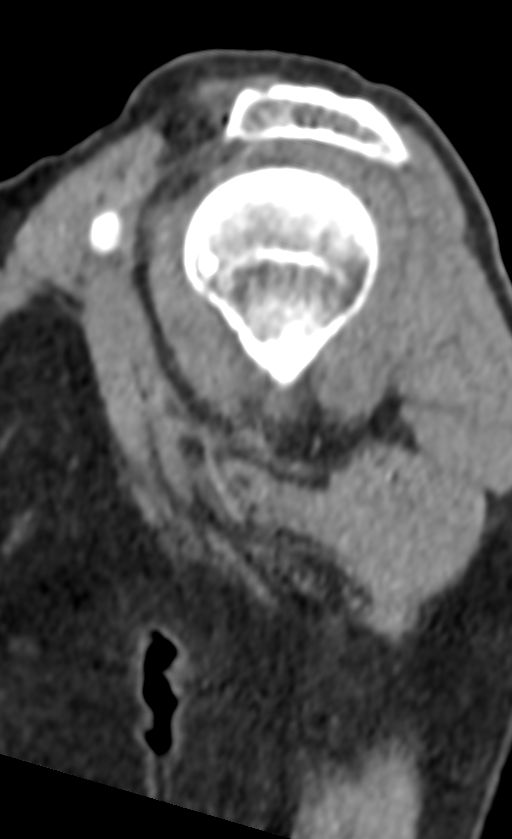
[im 35/103  bone]
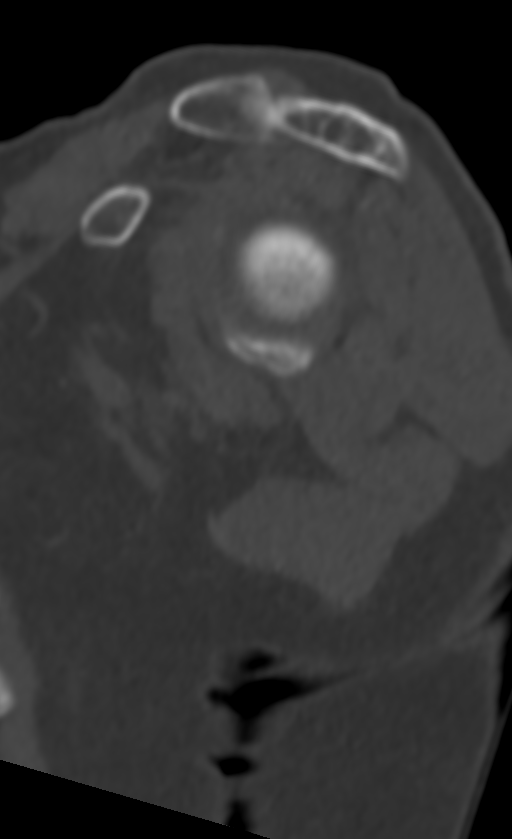
[im 52/103  bone]
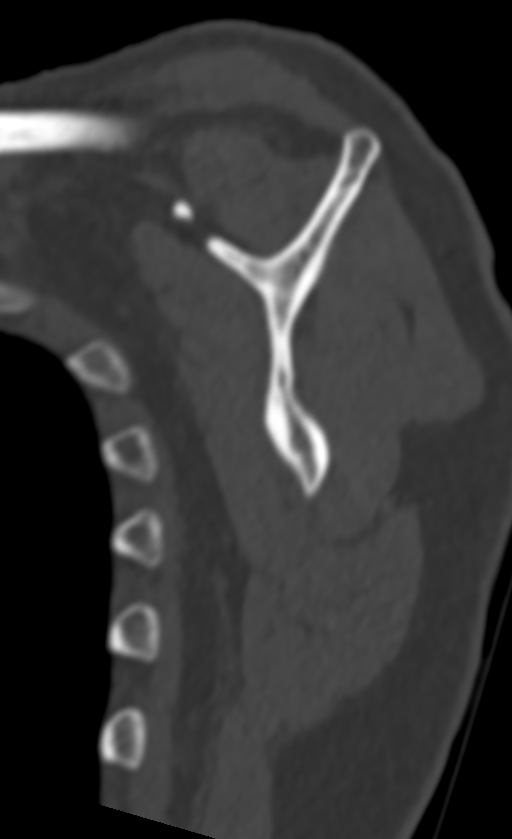
[im 69/103  bone]
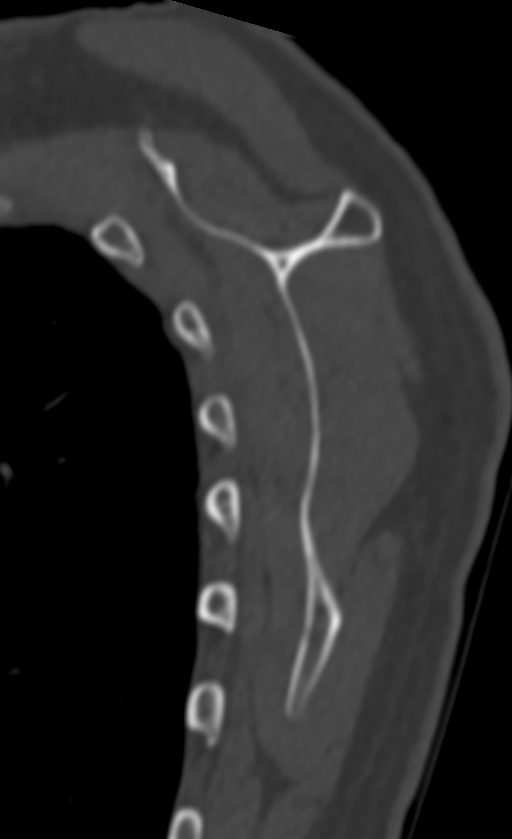
[im 86/103  bone]
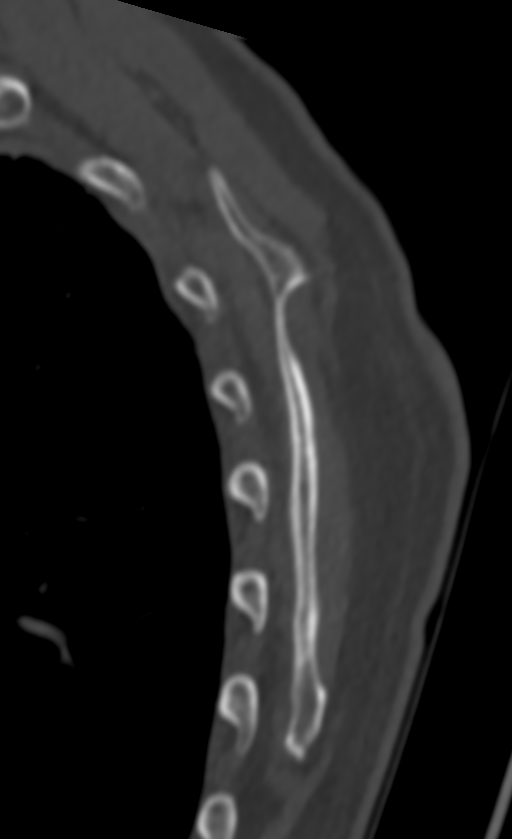

[12 of 36 positions shown; findings below may reference images not displayed]

FINDINGS: Bones/Joint/Cartilage

No focal bone lesion is identified. In particular, no lesion is seen
in the scapula as questioned on prior plain films. The appearance on
the prior exam is likely due to overlapping shadows. There is no
fracture or dislocation. Mild appearing acromioclavicular
osteoarthritis is noted.

Ligaments

Suboptimally assessed on CT.

Muscles and Tendons

As visualized by CT scan, all imaged muscles appear normal.

Soft tissues

Imaged lung parenchyma is clear.  Soft tissues are negative.
IMPRESSION: Negative for focal bone lesion. There is no evidence of neoplastic
process on this exam.

Mild acromioclavicular osteoarthritis.

## 2023-04-02 DIAGNOSIS — H35373 Puckering of macula, bilateral: Secondary | ICD-10-CM | POA: Diagnosis not present

## 2023-04-02 DIAGNOSIS — H353132 Nonexudative age-related macular degeneration, bilateral, intermediate dry stage: Secondary | ICD-10-CM | POA: Diagnosis not present

## 2023-04-02 DIAGNOSIS — H43813 Vitreous degeneration, bilateral: Secondary | ICD-10-CM | POA: Diagnosis not present

## 2023-04-02 DIAGNOSIS — Z961 Presence of intraocular lens: Secondary | ICD-10-CM | POA: Diagnosis not present

## 2023-04-07 ENCOUNTER — Encounter: Payer: Self-pay | Admitting: Internal Medicine

## 2023-04-07 ENCOUNTER — Ambulatory Visit (INDEPENDENT_AMBULATORY_CARE_PROVIDER_SITE_OTHER): Payer: Medicare (Managed Care) | Admitting: Internal Medicine

## 2023-04-07 VITALS — BP 124/72 | HR 72 | Temp 97.8°F | Ht 68.5 in | Wt 173.0 lb

## 2023-04-07 DIAGNOSIS — H938X2 Other specified disorders of left ear: Secondary | ICD-10-CM | POA: Diagnosis not present

## 2023-04-07 NOTE — Assessment & Plan Note (Signed)
Sounds like middle ear issue--but nothing clear on exam ??allergy related (might improve with change in seasons) Is on flonase Hearing is fine and no tinnitus/vertigo---so will hold off on ENT evaluation

## 2023-04-07 NOTE — Progress Notes (Signed)
Subjective:    Patient ID: Harry Ray, male    DOB: 09-15-1931, 89 y.o.   MRN: [id number]  HPI Here due to left ear fullness  For about 2 months--he notes a fullness in left ear Notices it first in the morning--or when up at night to void Feels "like I am on a mountain"---gets pressure (that improves with yawn---which this doesn't) Did have [facility]--no cerumen  No tinnitus No vertigo Hearing is okay  Current Outpatient Medications on File Prior to Visit  Medication Sig Dispense Refill   Cholecalciferol (VITAMIN D3) 25 MCG (1000 UT) CAPS Take 1 capsule (1,000 Units total) by mouth daily. 30 capsule    finasteride (PROSCAR) 5 MG tablet TAKE 1 TABLET BY MOUTH EVERY DAY 90 tablet 2   fluticasone (FLONASE) 50 MCG/ACT nasal spray Place 2 sprays into both nostrils daily as needed for allergies or rhinitis. 16 g 6   Lifitegrast (XIIDRA) 5 % SOLN      Multiple Vitamins-Minerals (ICAPS AREDS 2 PO) Take 1 tablet by mouth 2 (two) times daily.     tamsulosin (FLOMAX) 0.4 MG CAPS capsule TAKE 1 CAPSULE(0.4 MG) BY MOUTH DAILY 90 capsule 3   No current facility-administered medications on file prior to visit.    No Known Allergies  Past Medical History:  Diagnosis Date   Arthritis    pinkys bilaterally and left thumb   Carotid stenosis 10/07/2016   LICA 40-59%, RICA 1-39%, rpt 1 yr (09/2016)   Heart murmur    minor   History of asthma 1983   asthma attack as reaction to allergy medication (2d ICU stay)   History of chicken pox    Nephritis childhood   Seasonal allergies    spring   Urine incontinence     Past Surgical History:  Procedure Laterality Date   COLONOSCOPY  08/07/2005   Diverticulosis (Colon in [place])   SCROTAL SURGERY     ? benign cyst removal    Family History  Problem Relation Age of Onset   Dementia Mother        Pick's disease   Myasthenia gravis Brother 44   Cancer Maternal Grandfather        stomach   Cancer Paternal Uncle        throat  - smoker   Fibromyalgia Daughter    Diabetes Neg Hx    CAD Neg Hx     Social History   Socioeconomic History   Marital status: Widowed    Spouse name: Not on file   Number of children: Not on file   Years of education: Not on file   Highest education level: Master's degree (e.g., MA, MS, MEng, MEd, MSW, MBA)  Occupational History   Not on file  Tobacco Use   Smoking status: Never   Smokeless tobacco: Never  Vaping Use   Vaping status: Never Used  Substance and Sexual Activity   Alcohol use: No    Alcohol/week: 0.0 standard drinks of alcohol   Drug use: No   Sexual activity: Not on file  Other Topics Concern   Not on file  Social History Narrative   Widower, lives at [facility], no pets    Was married for 62 yrs    Wife suddenly passed away during a surgery 02-21-2019    Son and family live in [place]    Occ: retired, was [employer]   Activity: starting [facility] program 2x/wk, enjoys walking   Social  Determinants of Health   Financial Resource Strain: Low Risk  (04/06/2023)   Overall Financial Resource Strain (CARDIA)    Difficulty of Paying Living Expenses: Not hard at all  Food Insecurity: No Food Insecurity (04/06/2023)   Hunger Vital Sign    Worried About Running Out of Food in the Last Year: Never true    Ran Out of Food in the Last Year: Never true  Transportation Needs: No Transportation Needs (04/06/2023)   PRAPARE - Administrator, Civil Service (Medical): No    Lack of Transportation (Non-Medical): No  Physical Activity: Sufficiently Active (04/06/2023)   Exercise Vital Sign    Days of Exercise per Week: 5 days    Minutes of Exercise per Session: 40 min  Stress: No Stress Concern Present (04/06/2023)   [employer] of Occupational Health - Occupational Stress Questionnaire    Feeling of Stress : Only a little  Social Connections: Moderately Integrated (04/06/2023)   Social Connection and Isolation Panel  [NHANES]    Frequency of Communication with Friends and Family: More than three times a week    Frequency of Social Gatherings with Friends and Family: Twice a week    Attends Religious Services: More than 4 times per year    Active Member of [employer] or Organizations: Yes    Attends Banker Meetings: More than 4 times per year    Marital Status: Widowed  Intimate Partner Violence: Not At Risk (09/30/2022)   Humiliation, Afraid, Rape, and Kick questionnaire    Fear of Current or Ex-Partner: No    Emotionally Abused: No    Physically Abused: No    Sexually Abused: No   Review of Systems Mostly avoids salt Weight is steady--down a bit with some exercise     Objective:   Physical Exam Constitutional:      Appearance: Normal appearance.  HENT:     Right Ear: Tympanic membrane and ear canal normal.     Left Ear: Ear canal normal.     Ears:     Comments: No tragal tenderness Left TM may be slightly retracted Very small red spot in mid TM--not an infection Neurological:     Mental Status: He is alert.            Assessment & Plan:

## 2023-05-17 ENCOUNTER — Other Ambulatory Visit: Payer: Self-pay | Admitting: Family Medicine

## 2023-05-17 DIAGNOSIS — N401 Enlarged prostate with lower urinary tract symptoms: Secondary | ICD-10-CM

## 2023-05-18 DIAGNOSIS — N401 Enlarged prostate with lower urinary tract symptoms: Secondary | ICD-10-CM

## 2023-05-18 NOTE — Telephone Encounter (Signed)
Opened in error

## 2023-05-28 ENCOUNTER — Other Ambulatory Visit: Payer: Self-pay | Admitting: Family Medicine

## 2023-05-28 DIAGNOSIS — N401 Enlarged prostate with lower urinary tract symptoms: Secondary | ICD-10-CM

## 2023-05-28 NOTE — Telephone Encounter (Signed)
Copied from CRM [id number]. Topic: Clinical - Medication Refill >> May 28, 2023 11:22 AM [name] wrote: Most Recent Primary Care Visit:  Provider: Tillman Abide I  Department: [facility]  Visit Type: OFFICE VISIT  Date: 04/07/2023  Medication: tamsulosin (FLOMAX) 0.4 MG CAPS capsule  Has the patient contacted their pharmacy? Yes (Agent: If no, request that the patient contact the pharmacy for the refill. If patient does not wish to contact the pharmacy document the reason why and proceed with request.) (Agent: If yes, when and what did the pharmacy advise?) Pharmacy stated they contacted office for refill but no response back   Is this the correct pharmacy for this prescription? Yes If no, delete pharmacy and type the correct one.  This is the patient's preferred pharmacy:  [id number] - [address] [place] [address] Phone: [phone] Fax: [phone]   Has the prescription been filled recently? No  Is the patient out of the medication? No  Has the patient been seen for an appointment in the last year OR does the patient have an upcoming appointment? Yes  Can we respond through MyChart? Yes  Agent: Please be advised that Rx refills may take up to 3 business days. We ask that you follow-up with your pharmacy.

## 2023-05-28 NOTE — Telephone Encounter (Signed)
Pt well overdue for CPE.   Plz schedule CPE and fasting lab (no food/drink- except water and/or blk coffee 5 hrs prior) for additional refills. Then send request back to [facility] to send refill.

## 2023-05-28 NOTE — Telephone Encounter (Signed)
Spoke to pt, scheduled cpe for 07/30/23

## 2023-05-31 ENCOUNTER — Other Ambulatory Visit: Payer: Self-pay

## 2023-05-31 DIAGNOSIS — N401 Enlarged prostate with lower urinary tract symptoms: Secondary | ICD-10-CM

## 2023-05-31 MED ORDER — TAMSULOSIN HCL 0.4 MG PO CAPS: ORAL_CAPSULE | ORAL | 0 refills | Status: DC

## 2023-05-31 NOTE — Telephone Encounter (Signed)
Noted  

## 2023-07-30 ENCOUNTER — Encounter: Payer: Self-pay | Admitting: Family Medicine

## 2023-07-30 ENCOUNTER — Ambulatory Visit: Payer: Medicare Other | Admitting: Family Medicine

## 2023-07-30 VITALS — BP 132/72 | HR 88 | Temp 97.8°F | Ht 68.5 in | Wt 173.2 lb

## 2023-07-30 DIAGNOSIS — I6529 Occlusion and stenosis of unspecified carotid artery: Secondary | ICD-10-CM

## 2023-07-30 DIAGNOSIS — R3914 Feeling of incomplete bladder emptying: Secondary | ICD-10-CM

## 2023-07-30 DIAGNOSIS — R011 Cardiac murmur, unspecified: Secondary | ICD-10-CM

## 2023-07-30 DIAGNOSIS — Z8739 Personal history of other diseases of the musculoskeletal system and connective tissue: Secondary | ICD-10-CM | POA: Diagnosis not present

## 2023-07-30 DIAGNOSIS — J309 Allergic rhinitis, unspecified: Secondary | ICD-10-CM | POA: Diagnosis not present

## 2023-07-30 DIAGNOSIS — R10814 Left lower quadrant abdominal tenderness: Secondary | ICD-10-CM

## 2023-07-30 DIAGNOSIS — M85851 Other specified disorders of bone density and structure, right thigh: Secondary | ICD-10-CM

## 2023-07-30 DIAGNOSIS — E785 Hyperlipidemia, unspecified: Secondary | ICD-10-CM | POA: Diagnosis not present

## 2023-07-30 DIAGNOSIS — R739 Hyperglycemia, unspecified: Secondary | ICD-10-CM

## 2023-07-30 DIAGNOSIS — Z0000 Encounter for general adult medical examination without abnormal findings: Secondary | ICD-10-CM

## 2023-07-30 DIAGNOSIS — Z8673 Personal history of transient ischemic attack (TIA), and cerebral infarction without residual deficits: Secondary | ICD-10-CM

## 2023-07-30 DIAGNOSIS — N401 Enlarged prostate with lower urinary tract symptoms: Secondary | ICD-10-CM | POA: Diagnosis not present

## 2023-07-30 LAB — COMPREHENSIVE METABOLIC PANEL
ALT: 11 U/L (ref 0–53)
AST: 16 U/L (ref 0–37)
Albumin: 4.1 g/dL (ref 3.5–5.2)
Alkaline Phosphatase: 57 U/L (ref 39–117)
BUN: 20 mg/dL (ref 6–23)
CO2: 30 meq/L (ref 19–32)
Calcium: 9 mg/dL (ref 8.4–10.5)
Chloride: 105 meq/L (ref 96–112)
Creatinine, Ser: 0.92 mg/dL (ref 0.40–1.50)
GFR: 73.54 mL/min (ref >60.00)
Glucose, Bld: 96 mg/dL (ref 70–99)
Potassium: 4.4 meq/L (ref 3.5–5.1)
Sodium: 141 meq/L (ref 135–145)
Total Bilirubin: 0.4 mg/dL (ref 0.2–1.2)
Total Protein: 6.9 g/dL (ref 6.0–8.3)

## 2023-07-30 LAB — CBC WITH DIFFERENTIAL/PLATELET
Basophils Absolute: 0 10*3/uL (ref 0.0–0.1)
Basophils Relative: 0.6 % (ref 0.0–3.0)
Eosinophils Absolute: 0.4 10*3/uL (ref 0.0–0.7)
Eosinophils Relative: 5.1 % — ABNORMAL HIGH (ref 0.0–5.0)
HCT: 37.7 % — ABNORMAL LOW (ref 39.0–52.0)
Hemoglobin: 12.7 g/dL — ABNORMAL LOW (ref 13.0–17.0)
Lymphocytes Relative: 22.3 % (ref 12.0–46.0)
Lymphs Abs: 1.6 10*3/uL (ref 0.7–4.0)
MCHC: 33.6 g/dL (ref 30.0–36.0)
MCV: 96.7 fL (ref 78.0–100.0)
Monocytes Absolute: 0.6 10*3/uL (ref 0.1–1.0)
Monocytes Relative: 8.7 % (ref 3.0–12.0)
Neutro Abs: 4.4 10*3/uL (ref 1.4–7.7)
Neutrophils Relative %: 63.3 % (ref 43.0–77.0)
Platelets: 289 10*3/uL (ref 150.0–400.0)
RBC: 3.9 Mil/uL — ABNORMAL LOW (ref 4.22–5.81)
RDW: 13.9 % (ref 11.5–15.5)
WBC: 7 10*3/uL (ref 4.0–10.5)

## 2023-07-30 LAB — HEMOGLOBIN A1C: Hgb A1c MFr Bld: 5.8 % (ref 4.6–6.5)

## 2023-07-30 LAB — LIPID PANEL
Cholesterol: 160 mg/dL (ref 0–200)
HDL: 44.9 mg/dL (ref >39.00)
LDL Cholesterol: 88 mg/dL (ref 0–99)
NonHDL: 114.89
Total CHOL/HDL Ratio: 4
Triglycerides: 134 mg/dL (ref 0.0–149.0)
VLDL: 26.8 mg/dL (ref 0.0–40.0)

## 2023-07-30 LAB — VITAMIN D 25 HYDROXY (VIT D DEFICIENCY, FRACTURES): VITD: 30.95 ng/mL (ref 30.00–100.00)

## 2023-07-30 LAB — SEDIMENTATION RATE: Sed Rate: 17 mm/h (ref 0–20)

## 2023-07-30 MED ORDER — FINASTERIDE 5 MG PO TABS: 5.0000 mg | ORAL_TABLET | Freq: Every day | ORAL | 4 refills | Status: AC

## 2023-07-30 MED ORDER — TAMSULOSIN HCL 0.4 MG PO CAPS: ORAL_CAPSULE | ORAL | 4 refills | Status: DC

## 2023-07-30 MED ORDER — FLUTICASONE PROPIONATE 50 MCG/ACT NA SUSP: 2.0000 | Freq: Every day | NASAL | 6 refills | Status: AC | PRN

## 2023-07-30 MED ORDER — VITAMIN D3 25 MCG (1000 UT) PO CAPS: 2.0000 | ORAL_CAPSULE | Freq: Every day | ORAL | Status: AC

## 2023-07-30 NOTE — Assessment & Plan Note (Signed)
Chronic, stable off meds. Update FLP. The ASCVD Risk score (Arnett DK, et al., 2019) failed to calculate for the following reasons:   The 2019 ASCVD risk score is only valid for ages 32 to 32

## 2023-07-30 NOTE — Assessment & Plan Note (Addendum)
Mild. In h/o diverticulosis check CBC, CMP  Discussed bowel rest with clear liquid diet as first line if ongoing lower abd pain associated with bowel changes. Update if not improved for further evaluation.

## 2023-07-30 NOTE — Assessment & Plan Note (Signed)
 Preventative protocols reviewed and updated unless pt declined. Discussed healthy diet and lifestyle.

## 2023-07-30 NOTE — Assessment & Plan Note (Addendum)
Reviewed latest DEXA 02/2023 - osteopenia with increased fracture risk, now off prednisone.  Continue vit D and regular dietary calcium intake. Consider updated DEXA 2 yrs.  Consider bone strengthening medication.

## 2023-07-30 NOTE — Patient Instructions (Addendum)
Labs today  Try clear liquid diet, bland diet for 1-2 days to see if left lower abdominal pain improves. If ongoing, let us know.  You are doing well today  Return as needed or in 1 year for next physical

## 2023-07-30 NOTE — Progress Notes (Signed)
Ph: [phone] Fax: [phone]   Patient ID: Harry Ray, male    DOB: Apr 26, 1932, 89 y.o.   MRN: [id number]  This visit was conducted in person.  BP 132/72   Pulse 88   Temp 97.8 F (36.6 C) (Oral)   Ht 5' 8.5" (1.74 m)   Wt 173 lb 4 oz (78.6 kg)   SpO2 98%   BMI 25.96 kg/m    CC: CPE Subjective:   HPI: Harry Ray is a 89 y.o. male presenting on 07/30/2023 for Medicare Wellness (Wants to discuss vit D3.)   Saw health advisor 09/2022 for medicare wellness visit. Note reviewed.  Upcoming appt scheduled 10/2023 with health advisor.   Hearing Screening   500Hz  1000Hz  2000Hz  4000Hz   Right ear 0 0 0 40  Left ear 40 0 0 0  Vision Screening - Comments:: Last eye exam within past yr, thinks around 04/2023.  He failed hearing screen today. Denies significant trouble with hearing. He notes intermittent fullness to ears. Continues flonase.  Flowsheet Row Clinical Support from 09/30/2022 in [facility]  PHQ-2 Total Score 0          09/30/2022   10:11 AM 09/29/2022    3:41 PM 09/26/2021    9:12 AM 09/25/2020    8:52 AM 09/25/2019    9:33 AM  Fall Risk   Falls in the past year? 0 0 0 0 0  Number falls in past yr: 0 0 0 0 0  Injury with Fall? 0 0 0 0 0  Risk for fall due to : No Fall Risks  No Fall Risks Impaired balance/gait;Medication side effect No Fall Risks  Follow up Falls prevention discussed   Falls evaluation completed;Falls prevention discussed Falls evaluation completed;Falls prevention discussed    PMR - bilat shoulder pain started 04/2021. ESR was normal. Significant improvement with prednisone course. Completed prolonged steroid taper ~10/2022.   Found to have osteopenia on DEXA 11/2020 with increased fracture risk. Continues vit D 1000 units daily.  DEXA 02/2023 - T -2.4 RFN, at increased fracture risk (hip 4.6%)   BPH managed with flomax and finasteride  Preventative: COLONOSCOPY 08/07/2005 Diverticulosis (Colon in  [place]) - aged out. no blood in stool Prostate cancer screening - aged out. Lung cancer screening - not eligible DEXA 02/2023 - T -2.4 RFN, at increased fracture risk (hip 4.6%)  Flu shot - yearly COVID vaccine - Moderna 06/2019, 07/2019, Moderna booster x2 04/2020, 10/24/20, bivalent 02/2021, monovalent 10/2021, 09/2022, 02/2023.   Tdap 2016 Pneumovax 2011, prevnar 2016 Zostavax - 2011 Shingrix - 01/2023, 03/2023 HCPOA/Advanced directive scanned into chart 08/2016 - Harry Ray Harry Ray or Harry Ray Harry Ray are HCPOA. Grants discretion to [employer] for end of life decisions. Harry Ray lives in [place].  Seat belt use discussed Sunscreen use discussed. 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 Sees Dr Adolphus Birchwood yearly.  Non smoker Alcohol - none Eye exam yearly Dentist yearly Bowel - no constipation Bladder - no incontinence    Widower, at [facility], no pets  Was married for 62 yrs. Harry suddenly passed away on operating table 02/2019.  Harry Ray and family live in [place]. Harry Ray in [place]  Occ: retired, was [employer] ([employer])  Activity: [place] exercise program (less this year), walking 1-2 mi/day Diet: good water, fruits/vegetables daily     Relevant past medical, surgical, family and social history reviewed and updated as indicated. Interim medical history since our  last visit reviewed. Allergies and medications reviewed and updated. Outpatient Medications Prior to Visit  Medication Sig Dispense Refill   Lifitegrast (XIIDRA) 5 % SOLN      Multiple Vitamins-Minerals (ICAPS AREDS 2 PO) Take 1 tablet by mouth 2 (two) times daily.     Cholecalciferol (VITAMIN D3) 25 MCG (1000 UT) CAPS Take 1 capsule (1,000 Units total) by mouth daily. 30 capsule    finasteride (PROSCAR) 5 MG tablet TAKE 1 TABLET BY MOUTH EVERY DAY 90 tablet 1   fluticasone (FLONASE) 50 MCG/ACT nasal spray Place 2 sprays into both nostrils daily as needed for allergies or  rhinitis. 16 g 6   tamsulosin (FLOMAX) 0.4 MG CAPS capsule TAKE 1 CAPSULE(0.4 MG) BY MOUTH DAILY 90 capsule 0   No facility-administered medications prior to visit.     Per HPI unless specifically indicated in ROS section below Review of Systems  Constitutional:  Negative for activity change, appetite change, chills, fatigue, fever and unexpected weight change.  HENT:  Negative for hearing loss.   Eyes:  Negative for visual disturbance.  Respiratory:  Negative for cough, chest tightness, shortness of breath and wheezing.   Cardiovascular:  Negative for chest pain, palpitations and leg swelling.  Gastrointestinal:  Negative for abdominal distention, abdominal pain, blood in stool, constipation, diarrhea, nausea and vomiting.  Genitourinary:  Negative for difficulty urinating and hematuria.  Musculoskeletal:  Negative for arthralgias, myalgias and neck pain.  Skin:  Negative for rash.  Neurological:  Negative for dizziness, seizures, syncope and headaches.  Hematological:  Negative for adenopathy. Does not bruise/bleed easily.  Psychiatric/Behavioral:  Negative for dysphoric mood. The patient is not nervous/anxious.     Objective:  BP 132/72   Pulse 88   Temp 97.8 F (36.6 C) (Oral)   Ht 5' 8.5" (1.74 m)   Wt 173 lb 4 oz (78.6 kg)   SpO2 98%   BMI 25.96 kg/m   Wt Readings from Last 3 Encounters:  07/30/23 173 lb 4 oz (78.6 kg)  04/07/23 173 lb (78.5 kg)  11/11/22 178 lb (80.7 kg)      Physical Exam Vitals and nursing note reviewed.  Constitutional:      General: He is not in acute distress.    Appearance: Normal appearance. He is well-developed. He is not ill-appearing.  HENT:     Head: Normocephalic and atraumatic.     Right Ear: Hearing, tympanic membrane, ear canal and external ear normal.     Left Ear: Hearing, tympanic membrane, ear canal and external ear normal.     Mouth/Throat:     Mouth: Mucous membranes are moist.     Pharynx: Oropharynx is clear. No  oropharyngeal exudate or posterior oropharyngeal erythema.  Eyes:     General: No scleral icterus.    Extraocular Movements: Extraocular movements intact.     Conjunctiva/sclera: Conjunctivae normal.     Pupils: Pupils are equal, round, and reactive to light.  Neck:     Thyroid: No thyroid mass or thyromegaly.     Vascular: No carotid bruit.  Cardiovascular:     Rate and Rhythm: Normal rate and regular rhythm.     Pulses: Normal pulses.          Radial pulses are 2+ on the right side and 2+ on the left side.     Heart sounds: Murmur (3/6 systolic best at apex) heard.  Pulmonary:     Effort: Pulmonary effort is normal. No respiratory distress.     Breath  sounds: Normal breath sounds. No wheezing, rhonchi or rales.  Abdominal:     General: Bowel sounds are normal. There is no distension.     Palpations: Abdomen is soft. There is no mass.     Tenderness: There is abdominal tenderness (mild) in the left lower quadrant. There is no guarding or rebound. Negative signs include Murphy's sign.     Hernia: No hernia is present.     Comments: No hernia  Musculoskeletal:        General: Normal range of motion.     Cervical back: Normal range of motion and neck supple.     Right lower leg: No edema.     Left lower leg: No edema.  Lymphadenopathy:     Cervical: No cervical adenopathy.  Skin:    General: Skin is warm and dry.     Findings: No rash.  Neurological:     General: No focal deficit present.     Mental Status: He is alert and oriented to person, place, and time.  Psychiatric:        Mood and Affect: Mood normal.        Behavior: Behavior normal.        Thought Content: Thought content normal.        Judgment: Judgment normal.       Results for orders placed or performed in visit on 04/01/22  POCT glycosylated hemoglobin (Hb A1C)   Collection Time: 04/01/22  3:18 PM  Result Value Ref Range   Hemoglobin A1C 5.7 (A) 4.0 - 5.6 %   HbA1c POC (<> result, manual entry)      HbA1c, POC (prediabetic range)     HbA1c, POC (controlled diabetic range)     Lab Results  Component Value Date   CHOL 175 03/25/2022   HDL 62.70 03/25/2022   LDLCALC 90 03/25/2022   TRIG 113.0 03/25/2022   CHOLHDL 3 03/25/2022    Echocardiogram 2016: LVEF 65-70%, normal wall motion, mild LV diastolic dysfunction, physiologic MR and TR, aortic sclerosis without stenosis.   Assessment & Plan:   Problem List Items Addressed This Visit     Health maintenance examination - Primary (Chronic)   Preventative protocols reviewed and updated unless pt declined. Discussed healthy diet and lifestyle.       BPH (benign prostatic hyperplasia)   Chronic, stable period on flomax and finasteride.       Relevant Medications   tamsulosin (FLOMAX) 0.4 MG CAPS capsule   finasteride (PROSCAR) 5 MG tablet   Systolic murmur   Again heard today. H/o aortic sclerosis without stenosis. Pt largely asxs.       Carotid stenosis   No bruits appreciated today.       Dyslipidemia   Chronic, stable off meds. Update FLP. The ASCVD Risk score (Arnett DK, et al., 2019) failed to calculate for the following reasons:   The 2019 ASCVD risk score is only valid for ages 30 to 91       Relevant Orders   Lipid panel   Comprehensive metabolic panel   Osteopenia   Reviewed latest DEXA 02/2023 - osteopenia with increased fracture risk, now off prednisone.  Continue vit D and regular dietary calcium intake. Consider updated DEXA 2 yrs.  Consider bone strengthening medication.       Relevant Orders   VITAMIN D 25 Hydroxy (Vit-D Deficiency, Fractures)   History of polymyalgia rheumatica   Completed prolonged prednisone taper summer 2024.  No further symptoms.  Relevant Orders   Sedimentation rate   Hyperglycemia   Update A1c.       Relevant Orders   Hemoglobin A1c   History of ischemic stroke   Incidentally noted on brain MRI 2023. Could consider starting aspirin, statin      LLQ abdominal  tenderness   Mild. In h/o diverticulosis check CBC, CMP  Discussed bowel rest with clear liquid diet as first line if ongoing lower abd pain associated with bowel changes. Update if not improved for further evaluation.       Relevant Orders   Comprehensive metabolic panel   CBC with Differential/Platelet   Other Visit Diagnoses       Allergic rhinitis, unspecified seasonality, unspecified trigger       Relevant Medications   fluticasone (FLONASE) 50 MCG/ACT nasal spray        Meds ordered this encounter  Medications   fluticasone (FLONASE) 50 MCG/ACT nasal spray    Sig: Place 2 sprays into both nostrils daily as needed for allergies or rhinitis.    Dispense:  16 g    Refill:  6   tamsulosin (FLOMAX) 0.4 MG CAPS capsule    Sig: TAKE 1 CAPSULE(0.4 MG) BY MOUTH DAILY    Dispense:  90 capsule    Refill:  4   Cholecalciferol (VITAMIN D3) 25 MCG (1000 UT) CAPS    Sig: Take 2 capsules (2,000 Units total) by mouth daily.   finasteride (PROSCAR) 5 MG tablet    Sig: Take 1 tablet (5 mg total) by mouth daily.    Dispense:  90 tablet    Refill:  4    Orders Placed This Encounter  Procedures   Lipid panel   Comprehensive metabolic panel   CBC with Differential/Platelet   VITAMIN D 25 Hydroxy (Vit-D Deficiency, Fractures)   Hemoglobin A1c   Sedimentation rate    Patient Instructions  Labs today  Try clear liquid diet, bland diet for 1-2 days to see if left lower abdominal pain improves. If ongoing, let us know.  You are doing well today  Return as needed or in 1 year for next physical  Follow up plan: Return in about 1 year (around 07/29/2024) for annual exam, prior fasting for blood work.  Eustaquio Boyden, MD

## 2023-07-30 NOTE — Assessment & Plan Note (Addendum)
Again heard today. H/o aortic sclerosis without stenosis. Pt largely asxs.

## 2023-07-30 NOTE — Assessment & Plan Note (Signed)
 Update A1c ?

## 2023-07-30 NOTE — Assessment & Plan Note (Signed)
No bruits appreciated today  

## 2023-07-30 NOTE — Assessment & Plan Note (Addendum)
Completed prolonged prednisone taper summer 2024.  No further symptoms.

## 2023-07-30 NOTE — Assessment & Plan Note (Signed)
Chronic, stable period on flomax and finasteride.

## 2023-07-30 NOTE — Assessment & Plan Note (Signed)
Incidentally noted on brain MRI 2023. Could consider starting aspirin, statin

## 2023-08-02 ENCOUNTER — Encounter: Payer: Self-pay | Admitting: Family Medicine

## 2023-10-14 ENCOUNTER — Ambulatory Visit: Payer: Medicare Other

## 2023-10-14 VITALS — Ht 68.5 in | Wt 173.0 lb

## 2023-10-14 DIAGNOSIS — Z0000 Encounter for general adult medical examination without abnormal findings: Secondary | ICD-10-CM

## 2023-10-14 NOTE — Patient Instructions (Addendum)
 [name] , Thank you for taking time out of your busy schedule to complete your Annual Wellness Visit with me. I enjoyed our conversation and look forward to speaking with you again next year. I, as well as your care team,  appreciate your ongoing commitment to your health goals. Please review the following plan we discussed and let me know if I can assist you in the future. Your Game plan/ To Do List    Follow up Visits: Next Medicare AWV with our clinical staff: 10/17/24 @ 3pm teleivsit   Have you seen your provider in the last 6 months (3 months if uncontrolled diabetes)? Yes Next Office Visit with your provider: not due until 2/26  Clinician Recommendations:  Aim for 30 minutes of exercise or brisk walking, 6-8 glasses of water, and 5 servings of fruits and vegetables each day. Pt continue to do balance and strengthening exercises.      This is a list of the screening recommended for you and due dates:  Health Maintenance  Topic Date Due   COVID-19 Vaccine (9 - 2024-25 season) 09/03/2023   Flu Shot  01/07/2024   DTaP/Tdap/Td vaccine (2 - Td or Tdap) 08/05/2024   Medicare Annual Wellness Visit  10/13/2024   Pneumonia Vaccine  Completed   Zoster (Shingles) Vaccine  Completed   HPV Vaccine  Aged Out   Meningitis B Vaccine  Aged Out    Advanced directives: (Copy Requested) Please bring a copy of your health care power of attorney and living will to the office to be added to your chart at your convenience. You can mail to [facility] [address] [place], [place] [address] or email to [email or web address] Advance Care Planning is important because it: [x]  Makes sure you receive the medical care that is consistent with your values, goals, and preferences [x]  It provides guidance to your family and loved ones and reduces their decisional burden about whether or not they are making the right decisions based on your wishes.  Follow the link provided in your after  visit summary or read over the paperwork we have mailed to you to help you started getting your Advance Directives in place. If you need assistance in completing these, please reach out to us  so that we can help you!

## 2023-10-14 NOTE — Progress Notes (Signed)
 Please attest and cosign this visit due to patients primary care provider not being in the office at the time the visit was completed.    Subjective:   Harry Ray is a 89 y.o. who presents for a Medicare Wellness preventive visit.  Visit Complete: Virtual I connected with  Harry Ray on 10/14/23 by a audio enabled telemedicine application and verified that I am speaking with the correct person using two identifiers.  Patient Location: Home  Provider Location: Office/Clinic  I discussed the limitations of evaluation and management by telemedicine. The patient expressed understanding and agreed to proceed.  Vital Signs: Because this visit was a virtual/telehealth visit, some criteria may be missing or patient reported. Any vitals not documented were not able to be obtained and vitals that have been documented are patient reported.  VideoDeclined- This patient declined Librarian, academic. Therefore the visit was completed with audio only.  Persons Participating in Visit: Patient.  AWV Questionnaire: No: Patient Medicare AWV questionnaire was not completed prior to this visit.  Cardiac Risk Factors include: advanced age (>105men, >19 women);dyslipidemia;male gender     Objective:     Today's Vitals   10/14/23 1439 10/14/23 1440  Weight: 173 lb (78.5 kg)   Height: 5' 8.5" (1.74 m)   PainSc:  1    Body mass index is 25.92 kg/m.     10/14/2023    2:55 PM 11/11/2022    2:58 PM 09/30/2022   10:13 AM 09/26/2021    9:13 AM 09/25/2020    8:51 AM 09/25/2019    9:33 AM 09/23/2018    8:28 AM  Advanced Directives  Does Patient Have a Medical Advance Directive? Yes Yes Yes Yes Yes Yes Yes  Type of Estate agent of [place];Living will Healthcare Power of [place];Living will Healthcare Power of [place];Living will Healthcare Power of [place];Living will Healthcare Power of [place];Living will Healthcare Power of [place];Living  will Healthcare Power of [place];Living will  Does patient want to make changes to medical advance directive?  No - Patient declined  No - Patient declined     Copy of Healthcare Power of Attorney in Chart? No - copy requested No - copy requested No - copy requested No - copy requested No - copy requested Yes - validated most recent copy scanned in chart (See row information) Yes - validated most recent copy scanned in chart (See row information)  Would patient like information on creating a medical advance directive?       No - Patient declined    Current Medications (verified) Outpatient Encounter Medications as of 10/14/2023  Medication Sig   Cholecalciferol (VITAMIN D3) 25 MCG (1000 UT) CAPS Take 2 capsules (2,000 Units total) by mouth daily.   finasteride  (PROSCAR ) 5 MG tablet Take 1 tablet (5 mg total) by mouth daily.   fluticasone  (FLONASE ) 50 MCG/ACT nasal spray Place 2 sprays into both nostrils daily as needed for allergies or rhinitis.   Lifitegrast (XIIDRA) 5 % SOLN    Multiple Vitamins-Minerals (ICAPS AREDS 2 PO) Take 1 tablet by mouth 2 (two) times daily.   tamsulosin  (FLOMAX ) 0.4 MG CAPS capsule TAKE 1 CAPSULE(0.4 MG) BY MOUTH DAILY   No facility-administered encounter medications on file as of 10/14/2023.    Allergies (verified) Patient has no known allergies.   History: Past Medical History:  Diagnosis Date   Anxiety Recently have felt a kind of "over load of responsibility   Arthritis    pinkys bilaterally  and left thumb   Asthma SEASONAL   Cancer (HCC) skin cancers   Carotid stenosis 10/07/2016   LICA 40-59%, RICA 1-39%, rpt 1 yr (09/2016)   Cataract    Depression in last 2-3 months   fairly brief instances   Heart murmur    minor   History of asthma 1983   asthma attack as reaction to allergy medication (2d ICU stay)   History of chicken pox    Nephritis childhood   Neuromuscular disorder (HCC) under analysis   Seasonal allergies    spring   Urine  incontinence    Past Surgical History:  Procedure Laterality Date   COLONOSCOPY  08/07/2005   Diverticulosis (Colon in [place])   EYE SURGERY  cataract removal   SCROTAL SURGERY     ? benign cyst removal   Family History  Problem Relation Age of Onset   Dementia Mother        Pick's disease   Myasthenia gravis Brother 40   Cancer Maternal Grandfather        stomach   Cancer Paternal Uncle        throat - smoker   Fibromyalgia Daughter    Arthritis Brother    Diabetes Neg Hx    CAD Neg Hx    Social History   Socioeconomic History   Marital status: Widowed    Spouse name: Not on file   Number of children: Not on file   Years of education: Not on file   Highest education level: Master's degree (e.g., MA, MS, MEng, MEd, MSW, MBA)  Occupational History   Not on file  Tobacco Use   Smoking status: Never   Smokeless tobacco: Never  Vaping Use   Vaping status: Never Used  Substance and Sexual Activity   Alcohol use: No   Drug use: No   Sexual activity: Not Currently  Other Topics Concern   Not on file  Social History Narrative   Widower, lives at [facility], no pets    Was married for 62 yrs    Wife suddenly passed away during a surgery 03/17/2019    Son and family live in [place]    [place]: retired, was [employer]   Activity: starting [place] exercise program 2x/wk, enjoys walking   Social Drivers of Corporate investment banker Strain: Low Risk  (10/14/2023)   Overall Financial Resource Strain (CARDIA)    Difficulty of Paying Living Expenses: Not hard at all  Food Insecurity: No Food Insecurity (10/14/2023)   Hunger Vital Sign    Worried About Running Out of Food in the Last Year: Never true    Ran Out of Food in the Last Year: Never true  Transportation Needs: No Transportation Needs (10/14/2023)   PRAPARE - Administrator, Civil Service (Medical): No    Lack of Transportation (Non-Medical): No  Physical Activity: Sufficiently Active  (10/14/2023)   Exercise Vital Sign    Days of Exercise per Week: 6 days    Minutes of Exercise per Session: 40 min  Stress: No Stress Concern Present (10/14/2023)   [employer] of Occupational Health - Occupational Stress Questionnaire    Feeling of Stress : Only a little  Social Connections: Moderately Isolated (10/14/2023)   Social Connection and Isolation Panel [NHANES]    Frequency of Communication with Friends and Family: More than three times a week    Frequency of Social Gatherings with Friends and Family: Once a week  Attends Religious Services: More than 4 times per year    Active Member of Clubs or Organizations: No    Attends Banker Meetings: Never    Marital Status: Widowed    Tobacco Counseling Counseling given: Not Answered    Clinical Intake:  Pre-visit preparation completed: Yes  Pain : 0-10 Pain Score: 1  Pain Type: Acute pain Pain Location: Finger (Comment which one) (ring finger) Pain Orientation: Right Pain Descriptors / Indicators: Aching Pain Onset: 1 to 4 weeks ago Pain Frequency: Intermittent Pain Relieving Factors: none  Pain Relieving Factors: none  BMI - recorded: 25.92 Nutritional Status: BMI 25 -29 Overweight Nutritional Risks: None Diabetes: No  Lab Results  Component Value Date   HGBA1C 5.8 07/30/2023   HGBA1C 5.7 (A) 04/01/2022     How often do you need to have someone help you when you read instructions, pamphlets, or other written materials from your doctor or pharmacy?: 1 - Never  Interpreter Needed?: No  Comments: lives alone Information entered by :: B.Jeziah Kretschmer,LPN   Activities of Daily Living     10/14/2023    2:56 PM  In your present state of health, do you have any difficulty performing the following activities:  Hearing? 1  Vision? 0  Difficulty concentrating or making decisions? 0  Walking or climbing stairs? 0  Dressing or bathing? 0  Doing errands, shopping? 0  Preparing Food and eating ? N   Using the Toilet? N  In the past six months, have you accidently leaked urine? N  Do you have problems with loss of bowel control? N  Managing your Medications? N  Managing your Finances? N  Housekeeping or managing your Housekeeping? N    Patient Care Team: Claire Crick, MD as PCP - General (Family Medicine) Annell Kidney, MD as Referring Physician (Ophthalmology)  Indicate any recent Medical Services you may have received from other than [facility] providers in the past year (date may be approximate).     Assessment:    This is a routine wellness examination for [place].  Hearing/Vision screen Hearing Screening - Comments:: Pt says his hearing is good Vision Screening - Comments:: Pt says his vision is good w/glasses; Dr Ignatius Makos   Goals Addressed               This Visit's Progress     Patient Stated   On track     10/14/23- I will continue take medications as prescribed.       Patient Stated   On track     10/14/23- I will continue walking everyday for about 2 miles.      Patient stated (pt-stated)        10/14/23-I want to work on balance and flexibility better.       Depression Screen     10/14/2023    2:51 PM 07/30/2023    2:06 PM 09/30/2022   10:10 AM 07/03/2022   12:25 PM 09/26/2021    9:05 AM 09/25/2020    8:53 AM 09/25/2019    9:34 AM  PHQ 2/9 Scores  PHQ - 2 Score 0 0 0 0 0 2 2  PHQ- 9 Score  1    2 2     Fall Risk     10/14/2023    2:45 PM 07/30/2023    2:06 PM 09/30/2022   10:11 AM 09/29/2022    3:41 PM 09/26/2021    9:12 AM  Fall Risk   Falls in the past year? 0  0 0 0 0  Number falls in past yr: 0  0 0 0  Injury with Fall? 0  0 0 0  Risk for fall due to : No Fall Risks  No Fall Risks  No Fall Risks  Follow up Education provided;Falls prevention discussed  Falls prevention discussed      MEDICARE RISK AT HOME:  Medicare Risk at Home Any stairs in or around the home?: Yes If so, are there any without handrails?: Yes Home free of loose  throw rugs in walkways, pet beds, electrical cords, etc?: Yes Adequate lighting in your home to reduce risk of falls?: Yes Life alert?: Yes (necklace from retirement [employer]) Use of a cane, walker or w/c?: No Grab bars in the bathroom?: Yes Shower chair or bench in shower?: No Elevated toilet seat or a handicapped toilet?: No  TIMED UP AND GO:  Was the test performed?  No  Cognitive Function: 6CIT completed    09/25/2020    8:56 AM 09/25/2019    9:38 AM 09/23/2018    8:33 AM 09/09/2017    8:15 AM  MMSE - Mini Mental State Exam  Orientation to time 5 5 5 5   Orientation to Place 5 5 5 5   Registration 3 3 3 3   Attention/ Calculation 5 5 0 0  Recall 3 3 3 3   Language- name 2 objects   0 0  Language- repeat 1 1 1 1   Language- follow 3 step command   0 3  Language- read & follow direction   0 0  Write a sentence   0 0  Copy design   0 0  Total score   17 20        10/14/2023    2:57 PM 09/30/2022   10:13 AM 09/26/2021    9:13 AM  6CIT Screen  What Year? 0 points 0 points 0 points  What month? 0 points 0 points 0 points  What time? 0 points 0 points 0 points  Count back from 20 0 points 0 points 0 points  Months in reverse 0 points 0 points 0 points  Repeat phrase 0 points 0 points 0 points  Total Score 0 points 0 points 0 points    Immunizations Immunization History  Administered Date(s) Administered   Fluad Quad(high Dose 65+) 03/28/2019, 03/13/2020, 04/18/2021, 04/01/2022   Fluzone Influenza virus vaccine,trivalent (IIV3), split virus 03/21/2014   H1N1 05/19/2008   Influenza Inj Mdck Quad Pf 04/29/2015   Influenza Split 03/06/2008, 03/27/2011, 04/28/2012, 04/05/2013   Influenza,inj,Quad PF,6+ Mos 02/18/2016, 03/03/2017, 03/17/2018   Influenza-Unspecified 04/29/2015, 05/28/2023   Moderna Covid-19 Vaccine Bivalent Booster 68yrs & up 02/27/2021   Moderna SARS-COV2 Booster Vaccination 04/23/2020, 10/24/2020   Moderna Sars-Covid-2 Vaccination 06/26/2019,  07/24/2019   Pfizer(Comirnaty)Fall Seasonal Vaccine 12 years and older 11/04/2021, 09/15/2022   Pneumococcal Conjugate-13 07/17/2014   Pneumococcal Polysaccharide-23 04/09/2010   Tdap 08/06/2014   Unspecified SARS-COV-2 Vaccination 03/06/2023   Zoster Recombinant(Shingrix) 01/30/2023, 04/03/2023   Zoster, Live 05/15/2010    Screening Tests Health Maintenance  Topic Date Due   COVID-19 Vaccine (9 - 2024-25 season) 09/03/2023   INFLUENZA VACCINE  01/07/2024   DTaP/Tdap/Td (2 - Td or Tdap) 08/05/2024   Medicare Annual Wellness (AWV)  10/13/2024   Pneumonia Vaccine 101+ Years old  Completed   Zoster Vaccines- Shingrix  Completed   HPV VACCINES  Aged Out   Meningococcal B Vaccine  Aged Out    Health Maintenance  Health Maintenance Due  Topic  Date Due   COVID-19 Vaccine (9 - 2024-25 season) 09/03/2023   Health Maintenance Items Addressed: None needed  Additional Screening:  Vision Screening: Recommended annual ophthalmology exams for early detection of glaucoma and other disorders of the eye.  Dental Screening: Recommended annual dental exams for proper oral hygiene  Community Resource Referral / Chronic Care Management: CRR required this visit?  No   CCM required this visit?  No   Plan:    I have personally reviewed and noted the following in the patient's chart:   Medical and social history Use of alcohol, tobacco or illicit drugs  Current medications and supplements including opioid prescriptions. Patient is not currently taking opioid prescriptions. Functional ability and status Nutritional status Physical activity Advanced directives List of other physicians Hospitalizations, surgeries, and ER visits in previous 12 months Vitals Screenings to include cognitive, depression, and falls Referrals and appointments  In addition, I have reviewed and discussed with patient certain preventive protocols, quality metrics, and best practice recommendations. A written  personalized care plan for preventive services as well as general preventive health recommendations were provided to patient.   Nerissa Bannister, LPN   06/13/1094   After Visit Summary: (MyChart) Due to this being a telephonic visit, the after visit summary with patients personalized plan was offered to patient via MyChart   Notes: Nothing significant to report at this time.

## 2024-02-28 ENCOUNTER — Encounter: Payer: Self-pay | Admitting: Family Medicine

## 2024-02-29 ENCOUNTER — Encounter: Payer: Self-pay | Admitting: Family Medicine

## 2024-05-26 ENCOUNTER — Encounter: Payer: Self-pay | Admitting: Family Medicine

## 2024-05-29 ENCOUNTER — Ambulatory Visit (INDEPENDENT_AMBULATORY_CARE_PROVIDER_SITE_OTHER): Admitting: Nurse Practitioner

## 2024-05-29 ENCOUNTER — Encounter: Payer: Self-pay | Admitting: Nurse Practitioner

## 2024-05-29 VITALS — BP 130/70 | HR 106 | Temp 97.5°F | Ht 68.5 in | Wt 177.0 lb

## 2024-05-29 DIAGNOSIS — R3914 Feeling of incomplete bladder emptying: Secondary | ICD-10-CM

## 2024-05-29 DIAGNOSIS — N3945 Continuous leakage: Secondary | ICD-10-CM

## 2024-05-29 DIAGNOSIS — N401 Enlarged prostate with lower urinary tract symptoms: Secondary | ICD-10-CM

## 2024-05-29 LAB — POCT URINALYSIS DIPSTICK
Bilirubin, UA: NEGATIVE
Blood, UA: NEGATIVE
Glucose, UA: NEGATIVE
Ketones, UA: NEGATIVE
Leukocytes, UA: NEGATIVE
Nitrite, UA: NEGATIVE
Protein, UA: NEGATIVE
Spec Grav, UA: 1.015
Urobilinogen, UA: 0.2 U/dL
pH, UA: 5

## 2024-05-29 MED ORDER — TAMSULOSIN HCL 0.4 MG PO CAPS: 0.8000 mg | ORAL_CAPSULE | Freq: Every day | ORAL | 1 refills | Status: AC

## 2024-05-29 NOTE — Progress Notes (Signed)
 "  BP 130/70   Pulse (!) 106   Temp (!) 97.5 F (36.4 C)   Ht 5' 8.5 (1.74 m)   Wt 177 lb (80.3 kg)   SpO2 95%   BMI 26.52 kg/m    Subjective:    Patient ID: Harry Ray, male    DOB: 1932-06-04, 89 y.o.   MRN: [id number]  HPI: Harry Ray is a 89 y.o. male  Chief Complaint  Patient presents with   Urinary Incontinence    Pt c/o urinary incontinence x1 week.    Discussed the use of AI scribe software for clinical note transcription with the patient, who gave verbal consent to proceed.  History of Present Illness Harry Ray is a 89 year old male who presents with a recent episode of urinary incontinence.  Urinary incontinence - Experienced an episode of urinary incontinence one week ago after completing a mile-long walk - Felt a sudden urge to urinate and was unable to reach the bathroom in time, resulting in complete bladder emptying - First occurrence of complete incontinence; has had prior leakage and spurts - No further episodes of incontinence since the incident - No fever or back pain - Currently taking Proscar  5 mg daily and Flomax  0.4 mg daily without adverse effects  Cough and sputum production - Occasional cough with minimal phlegm production - No shortness of breath - No medication taken for cough         10/14/2023    2:51 PM 07/30/2023    2:06 PM 09/30/2022   10:10 AM  Depression screen PHQ 2/9  Decreased Interest 0 0 0  Down, Depressed, Hopeless 0 0 0  PHQ - 2 Score 0 0 0  Altered sleeping  1   Tired, decreased energy  0   Change in appetite  0   Feeling bad or failure about yourself   0   Trouble concentrating  0   Moving slowly or fidgety/restless  0   Suicidal thoughts  0   PHQ-9 Score  1       Data saved with a previous flowsheet row definition    Relevant past medical, surgical, family and social history reviewed and updated as indicated. Interim medical history since our last visit reviewed. Allergies and  medications reviewed and updated.  Review of Systems  Ten systems reviewed and is negative except as mentioned in HPI      Objective:      BP 130/70   Pulse (!) 106   Temp (!) 97.5 F (36.4 C)   Ht 5' 8.5 (1.74 m)   Wt 177 lb (80.3 kg)   SpO2 95%   BMI 26.52 kg/m    Wt Readings from Last 3 Encounters:  05/29/24 177 lb (80.3 kg)  10/14/23 173 lb (78.5 kg)  07/30/23 173 lb 4 oz (78.6 kg)    Physical Exam GENERAL: Alert, cooperative, well developed, no acute distress. HEENT: Normocephalic, normal oropharynx, moist mucous membranes. CHEST: Clear to auscultation bilaterally, no wheezes, rhonchi, or crackles. CARDIOVASCULAR: Normal heart rate and rhythm, S1 and S2 normal without murmurs. ABDOMEN: Soft, non-tender, non-distended, without organomegaly, normal bowel sounds. EXTREMITIES: No cyanosis or edema. NEUROLOGICAL: Cranial nerves grossly intact, moves all extremities without gross motor or sensory deficit.  Results for orders placed or performed in visit on 05/29/24  POCT urinalysis dipstick   Collection Time: 05/29/24  1:32 PM  Result Value Ref Range   Color, UA yellow    Clarity, UA clear  Glucose, UA Negative Negative   Bilirubin, UA negative    Ketones, UA negative    Spec Grav, UA 1.015 1.010 - 1.025   Blood, UA negative    pH, UA 5.0 5.0 - 8.0   Protein, UA Negative Negative   Urobilinogen, UA 0.2 0.2 or 1.0 E.U./dL   Nitrite, UA negative    Leukocytes, UA Negative Negative   Appearance     Odor            Assessment & Plan:   Problem List Items Addressed This Visit       Genitourinary   BPH (benign prostatic hyperplasia)   Relevant Medications   tamsulosin  (FLOMAX ) 0.4 MG CAPS capsule   Other Visit Diagnoses       Continuous leakage of urine    -  Primary   Relevant Medications   tamsulosin  (FLOMAX ) 0.4 MG CAPS capsule   Other Relevant Orders   POCT urinalysis dipstick (Completed)        Assessment and Plan Assessment &  Plan Benign prostatic hyperplasia with urinary incontinence Acute episode of urinary incontinence occurred one week ago after a mile-long walk, with sudden urge and inability to reach the bathroom in time. No fever or back pain reported. No prior history of similar episodes. Current medications include Proscar  5 mg daily and Flomax  0.4 mg daily. Urine test ruled out infection. Consideration of increasing Flomax  dosage to manage symptoms. - Increased Flomax  to 0.8 mg daily. - Continued Proscar  5 mg daily. - Instructed to follow up with prescribing physician regarding medication adjustment.  Chronic cough Intermittent production of phlegm without shortness of breath. No current medication for cough. Lungs sound clear on examination. - Increase fluid intake to thin mucus. - Reduce dairy intake to prevent thickening of mucus.        Follow up plan: Return if symptoms worsen or fail to improve. "

## 2024-10-17 ENCOUNTER — Ambulatory Visit
